# Patient Record
Sex: Female | Born: 1970 | Race: White | Hispanic: No | Marital: Married | Smoking: Never smoker
Health system: Southern US, Community
[De-identification: ages and names within clinical notes are randomized; demographics above are authoritative.]

## PROBLEM LIST (undated history)

## (undated) DIAGNOSIS — B019 Varicella without complication: Secondary | ICD-10-CM

## (undated) DIAGNOSIS — B029 Zoster without complications: Secondary | ICD-10-CM

## (undated) DIAGNOSIS — N200 Calculus of kidney: Secondary | ICD-10-CM

## (undated) DIAGNOSIS — Z9229 Personal history of other drug therapy: Secondary | ICD-10-CM

## (undated) DIAGNOSIS — B269 Mumps without complication: Secondary | ICD-10-CM

## (undated) HISTORY — DX: Varicella without complication: B01.9

## (undated) HISTORY — DX: Calculus of kidney: N20.0

## (undated) HISTORY — DX: Mumps without complication: B26.9

## (undated) HISTORY — DX: Zoster without complications: B02.9

## (undated) HISTORY — DX: Personal history of other drug therapy: Z92.29

---

## 1998-06-15 HISTORY — PX: COLPOSCOPY: SHX161

## 2015-02-28 ENCOUNTER — Encounter: Payer: Self-pay | Admitting: Family

## 2015-02-28 ENCOUNTER — Telehealth: Payer: Self-pay | Admitting: Family

## 2015-02-28 ENCOUNTER — Ambulatory Visit (INDEPENDENT_AMBULATORY_CARE_PROVIDER_SITE_OTHER)
Admission: RE | Admit: 2015-02-28 | Discharge: 2015-02-28 | Disposition: A | Discharge status: Home / Self Care | Payer: 59 | Source: Ambulatory Visit | Attending: Family | Admitting: Family

## 2015-02-28 ENCOUNTER — Encounter (INDEPENDENT_AMBULATORY_CARE_PROVIDER_SITE_OTHER): Payer: Self-pay

## 2015-02-28 ENCOUNTER — Ambulatory Visit (INDEPENDENT_AMBULATORY_CARE_PROVIDER_SITE_OTHER): Payer: 59 | Admitting: Family

## 2015-02-28 VITALS — BP 128/88 | HR 83 | Temp 97.6°F | Resp 18 | Ht 67.0 in | Wt 131.0 lb

## 2015-02-28 DIAGNOSIS — M25552 Pain in left hip: Secondary | ICD-10-CM | POA: Diagnosis not present

## 2015-02-28 DIAGNOSIS — Z1231 Encounter for screening mammogram for malignant neoplasm of breast: Secondary | ICD-10-CM | POA: Diagnosis not present

## 2015-02-28 NOTE — Progress Notes (Signed)
Subjective:    Patient ID: Connie Robinson, female    DOB: Aug 13, 1970, 44 y.o.   MRN: [id number]  Chief Complaint  Patient presents with  . Establish Care    states she has decreased mobility in her left hip, it is starting to affect her exercising, feels tight has been going on x2 months    HPI:  Connie Robinson is a 44 y.o. female with a PMH of kidney stones who presents today for an office visit to establish care.   1.) Mammogram - Has been performing mammograms since the age of 52 and notes she is due for her yearly mammogram.   2.) Hip pain - This is a new problem. Associated symptom of decreased mobility and pain located in the anterior aspect of  her left hip has been going on for about 2 months. Pain is described as occasional pinching sensation. Notes that she is able to workout on the Arc trainer, but is not able to use the elliptical. Modifying factor of yoga and stretching does help some, however she has noted there is restriction and not able to perform several positions that she used to be able to do. Denies any over the counter medications. Functionality she is able to walk up and down stairs and perform activities of daily living without difficulty. She denies trauma or sounds/sensation heard or felt.    No Known Allergies   No outpatient prescriptions prior to visit.   No facility-administered medications prior to visit.     Past Medical History  Diagnosis Date  . Kidney stones      History reviewed. No pertinent past surgical history.   Family History  Problem Relation Age of Onset  . Hypertension Mother   . Hyperlipidemia Paternal Grandmother   . Hyperlipidemia Paternal Grandfather      Social History   Social History  . Marital Status: Married    Spouse Name: N/A  . Number of Children: 2  . Years of Education: 18   Occupational History  . [employer]     Social History Main Topics  . Smoking status: Never Smoker   . Smokeless tobacco:  Never Used  . Alcohol Use: 0.0 oz/week    0 Standard drinks or equivalent per week     Comment: occasional  . Drug Use: No  . Sexual Activity: Yes    Birth Control/ Protection: Pill   Other Topics Concern  . Not on file   Social History Narrative   Fun: Ski, work out, travel   Denies religious beliefs effecting health care.    Denies abuse and feels safe at home.     Review of Systems  Constitutional: Negative for fever and chills.  Musculoskeletal:       Positive for decreased hip motion.   Neurological: Negative for weakness and numbness.      Objective:    BP 128/88 mmHg  Pulse 83  Temp(Src) 97.6 F (36.4 C) (Oral)  Resp 18  Ht  (1.702 m)  Wt 131 lb (59.421 kg)  BMI 20.51 kg/m2  SpO2 97% Nursing note and vital signs reviewed.  Physical Exam  Constitutional: She is oriented to person, place, and time. She appears well-developed and well-nourished. No distress.  Cardiovascular: Normal rate, regular rhythm, normal heart sounds and intact distal pulses.   Pulmonary/Chest: Effort normal and breath sounds normal.  Musculoskeletal:  Left hip with no obvious deformity, discoloration, or edema. Unable to elicit tenderness. Slight decrease in hip flexion compared  to opposite side, however range of motion in all directions is within normal limits. Muscle strength is 5 out of 5 in all directions. Distal pulses and sensation are intact and appropriate. Negative Thomas test and negative hip scouring  Neurological: She is alert and oriented to person, place, and time.  Skin: Skin is warm and dry.  Psychiatric: She has a normal mood and affect. Her behavior is normal. Judgment and thought content normal.       Assessment & Plan:   Problem List Items Addressed This Visit      Other   Left hip pain - Primary    Decreased hip mobility of undetermined origin. Appears to be muscular as symptoms improve with stretching, however no range of motion deficits exist. Symptoms are  only during activities like the ellipitical but are fine with the Arc trainer. Obtain x-ray to rule out underlying pathology. Refer to sports medicine for further assessment.       Relevant Orders   Ambulatory referral to Sports Medicine   DG HIP UNILAT WITH PELVIS 2-3 VIEWS LEFT   Encounter for screening mammogram for breast cancer   Relevant Orders   MM DIGITAL SCREENING BILATERAL

## 2015-02-28 NOTE — Progress Notes (Signed)
Pre visit review using our clinic review tool, if applicable. No additional management support is needed unless otherwise documented below in the visit note. 

## 2015-02-28 NOTE — Telephone Encounter (Signed)
Please inform patient that her x-ray is normal with no abnormality. Therefore please continue to follow up with Sports Medicine as planned.

## 2015-02-28 NOTE — Patient Instructions (Addendum)
Thank you for choosing [facility].  Summary/Instructions:  Please stop by radiology on the basement level of the building for your x-rays. Your results will be released to MyChart (or called to you) after review, usually within 72 hours after test completion. If any treatments or changes are necessary, you will be notified at that same time.  Referrals have been made during this visit. You should expect to hear back from our schedulers in about 7-10 days in regards to establishing an appointment with the specialists we discussed.   If your symptoms worsen or fail to improve, please contact our office for further instruction, or in case of emergency go directly to the emergency room at the closest medical facility.    Hip Exercises RANGE OF MOTION (ROM) AND STRETCHING EXERCISES  These exercises may help you when beginning to rehabilitate your injury. Doing them too aggressively can worsen your condition. Complete them slowly and gently. Your symptoms may resolve with or without further involvement from your physician, physical therapist or athletic trainer. While completing these exercises, remember:   Restoring tissue flexibility helps normal motion to return to the joints. This allows healthier, less painful movement and activity.  An effective stretch should be held for at least 30 seconds.  A stretch should never be painful. You should only feel a gentle lengthening or release in the stretched tissue. If these stretches worsen your symptoms even when done gently, consult your physician, physical therapist or athletic trainer. STRETCH - Hamstrings, Supine   Lie on your back. Loop a belt or towel over the ball of your right / left foot.  Straighten your right / left knee and slowly pull on the belt to raise your leg. Do not allow the right / left knee to bend. Keep your opposite leg flat on the floor.  Raise the leg until you feel a gentle stretch behind your right / left knee or  thigh. Hold this position for __________ seconds. Repeat __________ times. Complete this stretch __________ times per day.  STRETCH - Hip Rotators   Lie on your back on a firm surface. Grasp your right / left knee with your right / left hand and your ankle with your opposite hand.  Keeping your hips and shoulders firmly planted, gently pull your right / left knee and rotate your lower leg toward your opposite shoulder until you feel a stretch in your buttocks.  Hold this stretch for __________ seconds. Repeat this stretch __________ times. Complete this stretch __________ times per day. STRETCH - Hamstrings/Adductors, V-Sit   Sit on the floor with your legs extended in a large "V," keeping your knees straight.  With your head and chest upright, bend at your waist reaching for your right foot to stretch your left adductors.  You should feel a stretch in your left inner thigh. Hold for __________ seconds.  Return to the upright position to relax your leg muscles.  Continuing to keep your chest upright, bend straight forward at your waist to stretch your hamstrings.  You should feel a stretch behind both of your thighs and/or knees. Hold for __________ seconds.  Return to the upright position to relax your leg muscles.  Repeat steps 2 through 4 for opposite leg. Repeat __________ times. Complete this exercise __________ times per day.  STRETCHING - Hip Flexors, Lunge  Half kneel with your right / left knee on the floor and your opposite knee bent and directly over your ankle.  Keep good posture with your head  over your shoulders. Tighten your buttocks to point your tailbone downward; this will prevent your back from arching too much.  You should feel a gentle stretch in the front of your thigh and/or hip. If you do not feel any resistance, slightly slide your opposite foot forward and then slowly lunge forward so your knee once again lines up over your ankle. Be sure your tailbone  remains pointed downward.  Hold this stretch for __________ seconds. Repeat __________ times. Complete this stretch __________ times per day. STRENGTHENING EXERCISES These exercises may help you when beginning to rehabilitate your injury. They may resolve your symptoms with or without further involvement from your physician, physical therapist or athletic trainer. While completing these exercises, remember:   Muscles can gain both the endurance and the strength needed for everyday activities through controlled exercises.  Complete these exercises as instructed by your physician, physical therapist or athletic trainer. Progress the resistance and repetitions only as guided.  You may experience muscle soreness or fatigue, but the pain or discomfort you are trying to eliminate should never worsen during these exercises. If this pain does worsen, stop and make certain you are following the directions exactly. If the pain is still present after adjustments, discontinue the exercise until you can discuss the trouble with your clinician. STRENGTH - Hip Extensors, Bridge   Lie on your back on a firm surface. Bend your knees and place your feet flat on the floor.  Tighten your buttocks muscles and lift your bottom off the floor until your trunk is level with your thighs. You should feel the muscles in your buttocks and back of your thighs working. If you do not feel these muscles, slide your feet 1-2 inches further away from your buttocks.  Hold this position for __________ seconds.  Slowly lower your hips to the starting position and allow your buttock muscles relax completely before beginning the next repetition.  If this exercise is too easy, you may cross your arms over your chest. Repeat __________ times. Complete this exercise __________ times per day.  STRENGTH - Hip Abductors, Straight Leg Raises  Be aware of your form throughout the entire exercise so that you exercise the correct muscles.  Sloppy form means that you are not strengthening the correct muscles.  Lie on your side so that your head, shoulders, knee and hip line up. You may bend your lower knee to help maintain your balance. Your right / left leg should be on top.  Roll your hips slightly forward, so that your hips are stacked directly over each other and your right / left knee is facing forward.  Lift your top leg up 4-6 inches, leading with your heel. Be sure that your foot does not drift forward or that your knee does not roll toward the ceiling.  Hold this position for __________ seconds. You should feel the muscles in your outer hip lifting (you may not notice this until your leg begins to tire).  Slowly lower your leg to the starting position. Allow the muscles to fully relax before beginning the next repetition. Repeat __________ times. Complete this exercise __________ times per day.  STRENGTH - Hip Adductors, Straight Leg Raises   Lie on your side so that your head, shoulders, knee and hip line up. You may place your upper foot in front to help maintain your balance. Your right / left leg should be on the bottom.  Roll your hips slightly forward, so that your hips are stacked directly over  each other and your right / left knee is facing forward.  Tense the muscles in your inner thigh and lift your bottom leg 4-6 inches. Hold this position for __________ seconds.  Slowly lower your leg to the starting position. Allow the muscles to fully relax before beginning the next repetition. Repeat __________ times. Complete this exercise __________ times per day.  STRENGTH - Quadriceps, Straight Leg Raises  Quality counts! Watch for signs that the quadriceps muscle is working to insure you are strengthening the correct muscles and not "cheating" by substituting with healthier muscles.  Lay on your back with your right / left leg extended and your opposite knee bent.  Tense the muscles in the front of your right /  left thigh. You should see either your knee cap slide up or increased dimpling just above the knee. Your thigh may even quiver.  Tighten these muscles even more and raise your leg 4 to 6 inches off the floor. Hold for right / left seconds.  Keeping these muscles tense, lower your leg.  Relax the muscles slowly and completely in between each repetition. Repeat __________ times. Complete this exercise __________ times per day.  STRENGTH - Hip Abductors, Standing  Tie one end of a rubber exercise band/tubing to a secure surface (table, pole) and tie a loop at the other end.  Place the loop around your right / left ankle. Keeping your ankle with the band directly opposite of the secured end, step away until there is tension in the tube/band.  Hold onto a chair as needed for balance.  Keeping your back upright, your shoulders over your hips, and your toes pointing forward, lift your right / left leg out to your side. Be sure to lift your leg with your hip muscles. Do not "throw" your leg or tip your body to lift your leg.  Slowly and with control, return to the starting position. Repeat exercise __________ times. Complete this exercise __________ times per day.  STRENGTH - Quadriceps, Squats  Stand in a door frame so that your feet and knees are in line with the frame.  Use your hands for balance, not support, on the frame.  Slowly lower your weight, bending at the hips and knees. Keep your lower legs upright so that they are parallel with the door frame. Squat only within the range that does not increase your knee pain. Never let your hips drop below your knees.  Slowly return upright, pushing with your legs, not pulling with your hands. Document Released: 06/19/2005 Document Revised: 08/24/2011 Document Reviewed: 09/13/2008 [facility] Patient Information 2015 [place], [place]. This information is not intended to replace advice given to you by your health care provider. Make sure you discuss  any questions you have with your health care provider.

## 2015-02-28 NOTE — Assessment & Plan Note (Signed)
Decreased hip mobility of undetermined origin. Appears to be muscular as symptoms improve with stretching, however no range of motion deficits exist. Symptoms are only during activities like the ellipitical but are fine with the Arc trainer. Obtain x-ray to rule out underlying pathology. Refer to sports medicine for further assessment.

## 2015-03-01 ENCOUNTER — Encounter: Payer: Self-pay | Admitting: Family

## 2015-03-01 NOTE — Telephone Encounter (Signed)
LVM letting pt know results. 

## 2015-03-15 ENCOUNTER — Encounter: Payer: Self-pay | Admitting: Family

## 2015-03-15 ENCOUNTER — Ambulatory Visit (INDEPENDENT_AMBULATORY_CARE_PROVIDER_SITE_OTHER): Payer: 59 | Admitting: Family

## 2015-03-15 ENCOUNTER — Other Ambulatory Visit (INDEPENDENT_AMBULATORY_CARE_PROVIDER_SITE_OTHER): Payer: 59

## 2015-03-15 VITALS — BP 122/78 | HR 85 | Temp 98.6°F | Resp 18 | Ht 67.0 in | Wt 130.0 lb

## 2015-03-15 DIAGNOSIS — Z0000 Encounter for general adult medical examination without abnormal findings: Secondary | ICD-10-CM

## 2015-03-15 LAB — CBC
HEMATOCRIT: 43 % (ref 36.0–46.0)
HEMOGLOBIN: 14.6 g/dL (ref 12.0–15.0)
MCHC: 33.9 g/dL (ref 30.0–36.0)
MCV: 92.5 fl (ref 78.0–100.0)
Platelets: 295 10*3/uL (ref 150.0–400.0)
RBC: 4.65 Mil/uL (ref 3.87–5.11)
RDW: 12.2 % (ref 11.5–15.5)
WBC: 9.9 10*3/uL (ref 4.0–10.5)

## 2015-03-15 LAB — COMPREHENSIVE METABOLIC PANEL
ALT: 12 U/L (ref 0–35)
AST: 12 U/L (ref 0–37)
Albumin: 4.3 g/dL (ref 3.5–5.2)
Alkaline Phosphatase: 36 U/L — ABNORMAL LOW (ref 39–117)
BUN: 10 mg/dL (ref 6–23)
CO2: 27 meq/L (ref 19–32)
Calcium: 9.1 mg/dL (ref 8.4–10.5)
Chloride: 105 mEq/L (ref 96–112)
Creatinine, Ser: 0.67 mg/dL (ref 0.40–1.20)
GFR: 102.02 mL/min (ref >60.00)
GLUCOSE: 88 mg/dL (ref 70–99)
POTASSIUM: 4.1 meq/L (ref 3.5–5.1)
Sodium: 139 mEq/L (ref 135–145)
Total Bilirubin: 0.4 mg/dL (ref 0.2–1.2)
Total Protein: 6.9 g/dL (ref 6.0–8.3)

## 2015-03-15 LAB — LIPID PANEL
Cholesterol: 144 mg/dL (ref 0–200)
HDL: 49 mg/dL (ref >39.00)
LDL CALC: 77 mg/dL (ref 0–99)
NONHDL: 95.34
Total CHOL/HDL Ratio: 3
Triglycerides: 90 mg/dL (ref 0.0–149.0)
VLDL: 18 mg/dL (ref 0.0–40.0)

## 2015-03-15 LAB — TSH: TSH: 1.27 u[IU]/mL (ref 0.35–4.50)

## 2015-03-15 NOTE — Assessment & Plan Note (Signed)
1) Anticipatory Guidance: Discussed importance of wearing a seatbelt while driving and not texting while driving; changing batteries in smoke detector at least once annually; wearing suntan lotion when outside; eating a balanced and moderate diet; getting physical activity at least 30 minutes per day.  2) Immunizations / Screenings / Labs:  Declines flu today. All other immunizations are up-to-date per recommendations. Due for a dental screen which will be scheduled independently. All other screenings are up-to-date per recommendations. Obtain CBC, CMET, Lipid profile and TSH.   Overall well exam with minimal risk factors for cardiovascular disease. She has normal blood pressure, exercises regularly, and is a good body weight. Continue healthy lifestyle behaviors. Follow-up prevention exam in 1 year. Follow-up office visit pending blood work if needed.

## 2015-03-15 NOTE — Patient Instructions (Addendum)
Thank you for choosing [facility].  Summary/Instructions:  Please stop by the lab on the basement level of the building for your blood work. Your results will be released to MyChart (or called to you) after review, usually within 72 hours after test completion. If any changes need to be made, you will be notified at that same time.  If your symptoms worsen or fail to improve, please contact our office for further instruction, or in case of emergency go directly to the emergency room at the closest medical facility.    Health Maintenance Adopting a healthy lifestyle and getting preventive care can go a long way to promote health and wellness. Talk with your health care provider about what schedule of regular examinations is right for you. This is a good chance for you to check in with your provider about disease prevention and staying healthy. In between checkups, there are plenty of things you can do on your own. Experts have done a lot of research about which lifestyle changes and preventive measures are most likely to keep you healthy. Ask your health care provider for more information. WEIGHT AND DIET  Eat a healthy diet  Be sure to include plenty of vegetables, fruits, low-fat dairy products, and lean protein.  Do not eat a lot of foods high in solid fats, added sugars, or salt.  Get regular exercise. This is one of the most important things you can do for your health.  Most adults should exercise for at least 150 minutes each week. The exercise should increase your heart rate and make you sweat (moderate-intensity exercise).  Most adults should also do strengthening exercises at least twice a week. This is in addition to the moderate-intensity exercise.  Maintain a healthy weight  Body mass index (BMI) is a measurement that can be used to identify possible weight problems. It estimates body fat based on height and weight. Your health care provider can help determine your BMI  and help you achieve or maintain a healthy weight.  For females 20 years of age and older:   A BMI below 18.5 is considered underweight.  A BMI of 18.5 to 24.9 is normal.  A BMI of 25 to 29.9 is considered overweight.  A BMI of 30 and above is considered obese.  Watch levels of cholesterol and blood lipids  You should start having your blood tested for lipids and cholesterol at 43 years of age, then have this test every 5 years.  You may need to have your cholesterol levels checked more often if:  Your lipid or cholesterol levels are high.  You are older than 43 years of age.  You are at high risk for heart disease.  CANCER SCREENING   Lung Cancer  Lung cancer screening is recommended for adults 55-80 years old who are at high risk for lung cancer because of a history of smoking.  A yearly low-dose CT scan of the lungs is recommended for people who:  Currently smoke.  Have quit within the past 15 years.  Have at least a 30-pack-year history of smoking. A pack year is smoking an average of one pack of cigarettes a day for 1 year.  Yearly screening should continue until it has been 15 years since you quit.  Yearly screening should stop if you develop a health problem that would prevent you from having lung cancer treatment.  Breast Cancer  Practice breast self-awareness. This means understanding how your breasts normally appear and feel.  It   also means doing regular breast self-exams. Let your health care provider know about any changes, no matter how small.  If you are in your 20s or 30s, you should have a clinical breast exam (CBE) by a health care provider every 1-3 years as part of a regular health exam.  If you are 40 or older, have a CBE every year. Also consider having a breast X-ray (mammogram) every year.  If you have a family history of breast cancer, talk to your health care provider about genetic screening.  If you are at high risk for breast cancer,  talk to your health care provider about having an MRI and a mammogram every year.  Breast cancer gene (BRCA) assessment is recommended for women who have family members with BRCA-related cancers. BRCA-related cancers include:  Breast.  Ovarian.  Tubal.  Peritoneal cancers.  Results of the assessment will determine the need for genetic counseling and BRCA1 and BRCA2 testing. Cervical Cancer Routine pelvic examinations to screen for cervical cancer are no longer recommended for nonpregnant women who are considered low risk for cancer of the pelvic organs (ovaries, uterus, and vagina) and who do not have symptoms. A pelvic examination may be necessary if you have symptoms including those associated with pelvic infections. Ask your health care provider if a screening pelvic exam is right for you.   The Pap test is the screening test for cervical cancer for women who are considered at risk.  If you had a hysterectomy for a problem that was not cancer or a condition that could lead to cancer, then you no longer need Pap tests.  If you are older than 65 years, and you have had normal Pap tests for the past 10 years, you no longer need to have Pap tests.  If you have had past treatment for cervical cancer or a condition that could lead to cancer, you need Pap tests and screening for cancer for at least 20 years after your treatment.  If you no longer get a Pap test, assess your risk factors if they change (such as having a new sexual partner). This can affect whether you should start being screened again.  Some women have medical problems that increase their chance of getting cervical cancer. If this is the case for you, your health care provider may recommend more frequent screening and Pap tests.  The human papillomavirus (HPV) test is another test that may be used for cervical cancer screening. The HPV test looks for the virus that can cause cell changes in the cervix. The cells collected  during the Pap test can be tested for HPV.  The HPV test can be used to screen women 30 years of age and older. Getting tested for HPV can extend the interval between normal Pap tests from three to five years.  An HPV test also should be used to screen women of any age who have unclear Pap test results.  After 43 years of age, women should have HPV testing as often as Pap tests.  Colorectal Cancer  This type of cancer can be detected and often prevented.  Routine colorectal cancer screening usually begins at 43 years of age and continues through 43 years of age.  Your health care provider may recommend screening at an earlier age if you have risk factors for colon cancer.  Your health care provider may also recommend using home test kits to check for hidden blood in the stool.  A small camera at the end   of a tube can be used to examine your colon directly (sigmoidoscopy or colonoscopy). This is done to check for the earliest forms of colorectal cancer.  Routine screening usually begins at age 50.  Direct examination of the colon should be repeated every 5-10 years through 43 years of age. However, you may need to be screened more often if early forms of precancerous polyps or small growths are found. Skin Cancer  Check your skin from head to toe regularly.  Tell your health care provider about any new moles or changes in moles, especially if there is a change in a mole's shape or color.  Also tell your health care provider if you have a mole that is larger than the size of a pencil eraser.  Always use sunscreen. Apply sunscreen liberally and repeatedly throughout the day.  Protect yourself by wearing long sleeves, pants, a wide-brimmed hat, and sunglasses whenever you are outside. HEART DISEASE, DIABETES, AND HIGH BLOOD PRESSURE   Have your blood pressure checked at least every 1-2 years. High blood pressure causes heart disease and increases the risk of stroke.  If you are  between 55 years and 79 years old, ask your health care provider if you should take aspirin to prevent strokes.  Have regular diabetes screenings. This involves taking a blood sample to check your fasting blood sugar level.  If you are at a normal weight and have a low risk for diabetes, have this test once every three years after 43 years of age.  If you are overweight and have a high risk for diabetes, consider being tested at a younger age or more often. PREVENTING INFECTION  Hepatitis B  If you have a higher risk for hepatitis B, you should be screened for this virus. You are considered at high risk for hepatitis B if:  You were born in a country where hepatitis B is common. Ask your health care provider which countries are considered high risk.  Your parents were born in a high-risk country, and you have not been immunized against hepatitis B (hepatitis B vaccine).  You have HIV or AIDS.  You use needles to inject street drugs.  You live with someone who has hepatitis B.  You have had sex with someone who has hepatitis B.  You get hemodialysis treatment.  You take certain medicines for conditions, including cancer, organ transplantation, and autoimmune conditions. Hepatitis C  Blood testing is recommended for:  Everyone born from 1945 through 1965.  Anyone with known risk factors for hepatitis C. Sexually transmitted infections (STIs)  You should be screened for sexually transmitted infections (STIs) including gonorrhea and chlamydia if:  You are sexually active and are younger than 43 years of age.  You are older than 43 years of age and your health care provider tells you that you are at risk for this type of infection.  Your sexual activity has changed since you were last screened and you are at an increased risk for chlamydia or gonorrhea. Ask your health care provider if you are at risk.  If you do not have HIV, but are at risk, it may be recommended that you  take a prescription medicine daily to prevent HIV infection. This is called pre-exposure prophylaxis (PrEP). You are considered at risk if:  You are sexually active and do not regularly use condoms or know the HIV status of your partner(s).  You take drugs by injection.  You are sexually active with a partner who has HIV.   with your health care provider about whether you are at high risk of being infected with HIV. If you choose to begin PrEP, you should first be tested for HIV. You should then be tested every 3 months for as long as you are taking PrEP.  PREGNANCY   If you are premenopausal and you may become pregnant, ask your health care provider about preconception counseling.  If you may become pregnant, take 400 to 800 micrograms (mcg) of folic acid every day.  If you want to prevent pregnancy, talk to your health care provider about birth control (contraception). OSTEOPOROSIS AND MENOPAUSE   Osteoporosis is a disease in which the bones lose minerals and strength with aging. This can result in serious bone fractures. Your risk for osteoporosis can be identified using a bone density scan.  If you are 59 years of age or older, or if you are at risk for osteoporosis and fractures, ask your health care provider if you should be screened.  Ask your health care provider whether you should take a calcium or vitamin D supplement to lower your risk for osteoporosis.  Menopause may have certain physical symptoms and risks.  Hormone replacement therapy may reduce some of these symptoms and risks. Talk to your health care provider about whether hormone replacement therapy is right for you.  HOME CARE INSTRUCTIONS   Schedule regular health, dental, and eye exams.  Stay current with your immunizations.   Do not use any tobacco products including cigarettes, chewing tobacco, or electronic cigarettes.  If you are pregnant, do not drink alcohol.  If you are breastfeeding, limit how much and  how often you drink alcohol.  Limit alcohol intake to no more than 1 drink per day for nonpregnant women. One drink equals 12 ounces of beer, 5 ounces of wine, or 1 ounces of hard liquor.  Do not use street drugs.  Do not share needles.  Ask your health care provider for help if you need support or information about quitting drugs.  Tell your health care provider if you often feel depressed.  Tell your health care provider if you have ever been abused or do not feel safe at home. Document Released: 12/15/2010 Document Revised: 10/16/2013 Document Reviewed: 05/03/2013 [facility] Patient Information 2015 [place], [place]. This information is not intended to replace advice given to you by your health care provider. Make sure you discuss any questions you have with your health care provider.

## 2015-03-15 NOTE — Progress Notes (Signed)
Pre visit review using our clinic review tool, if applicable. No additional management support is needed unless otherwise documented below in the visit note. 

## 2015-03-15 NOTE — Progress Notes (Signed)
Subjective:    Patient ID: Connie Robinson, female    DOB: [birth year], 44 y.o.   MRN: [id number]  Chief Complaint  Patient presents with  . CPE    Fasting    HPI:  Connie Robinson is a 44 y.o. female who presents today for an annual wellness visit.   1) Health Maintenance -   Diet - Averages about 3 meals consisting of a balanced and healthy diet. Caffeine intake of about 3 cups of hot tea per day.   Exercise - 3-4x per week to the gym or yoga.    2) Preventative Exams / Immunizations:  Dental -- Due for exam   Vision -- Up to date   Health Maintenance  Topic Date Due  . HIV Screening  01/12/1987  . INFLUENZA VACCINE  02/28/2016 (Originally 01/14/2015)  . PAP SMEAR  10/14/2017  . TETANUS/TDAP  02/03/2020     There is no immunization history on file for this patient.  No Known Allergies   Outpatient Prescriptions Prior to Visit  Medication Sig Dispense Refill  . Multiple Vitamins-Minerals (MULTIVITAMIN & MINERAL PO) Take by mouth.     No facility-administered medications prior to visit.     Past Medical History  Diagnosis Date  . Kidney stones      No past surgical history on file.   Family History  Problem Relation Age of Onset  . Hypertension Mother   . Hyperlipidemia Paternal Grandmother   . Hyperlipidemia Paternal Grandfather      Social History   Social History  . Marital Status: Married    Spouse Name: N/A  . Number of Children: 2  . Years of Education: 18   Occupational History  . [employer]     Social History Main Topics  . Smoking status: Never Smoker   . Smokeless tobacco: Never Used  . Alcohol Use: 0.0 oz/week    0 Standard drinks or equivalent per week     Comment: occasional  . Drug Use: No  . Sexual Activity: Yes    Birth Control/ Protection: Pill   Other Topics Concern  . Not on file   Social History Narrative   Fun: Ski, work out, travel   Denies religious beliefs effecting health care.    Denies abuse and  feels safe at home.      Review of Systems  Constitutional: Denies fever, chills, fatigue, or significant weight gain/loss. HENT: Head: Denies headache or neck pain Ears: Denies changes in hearing, ringing in ears, earache, drainage Nose: Denies discharge, stuffiness, itching, nosebleed, sinus pain Throat: Denies sore throat, hoarseness, dry mouth, sores, thrush Eyes: Denies loss/changes in vision, pain, redness, blurry/double vision, flashing lights Cardiovascular: Denies chest pain/discomfort, tightness, palpitations, shortness of breath with activity, difficulty lying down, swelling, sudden awakening with shortness of breath Respiratory: Denies shortness of breath, cough, sputum production, wheezing Gastrointestinal: Denies dysphasia, heartburn, change in appetite, nausea, change in bowel habits, rectal bleeding, constipation, diarrhea, yellow skin or eyes Genitourinary: Denies frequency, urgency, burning/pain, blood in urine, incontinence, change in urinary strength. Musculoskeletal: Denies muscle/joint pain, stiffness, back pain, redness or swelling of joints, trauma Skin: Denies rashes, lumps, itching, dryness, color changes, or hair/nail changes Neurological: Denies dizziness, fainting, seizures, weakness, numbness, tingling, tremor Psychiatric - Denies nervousness, stress, depression or memory loss Endocrine: Denies heat or cold intolerance, sweating, frequent urination, excessive thirst, changes in appetite Hematologic: Denies ease of bruising or bleeding     Objective:    BP 122/78 mmHg  Pulse 85  Temp(Src) 98.6 F (37 C) (Oral)  Resp 18  Ht  (1.702 m)  Wt 130 lb (58.968 kg)  BMI 20.36 kg/m2  SpO2 99%  LMP 02/07/2015 (Approximate) Nursing note and vital signs reviewed.  Physical Exam  Constitutional: She is oriented to person, place, and time. She appears well-developed and well-nourished.  HENT:  Head: Normocephalic.  Right Ear: Hearing, tympanic membrane,  external ear and ear canal normal.  Left Ear: Hearing, tympanic membrane, external ear and ear canal normal.  Nose: Nose normal.  Mouth/Throat: Uvula is midline, oropharynx is clear and moist and mucous membranes are normal.  Eyes: Conjunctivae and EOM are normal. Pupils are equal, round, and reactive to light.  Neck: Neck supple. No JVD present. No tracheal deviation present. No thyromegaly present.  Cardiovascular: Normal rate, regular rhythm, normal heart sounds and intact distal pulses.   Pulmonary/Chest: Effort normal and breath sounds normal.  Abdominal: Soft. Bowel sounds are normal. She exhibits no distension and no mass. There is no tenderness. There is no rebound and no guarding.  Musculoskeletal: Normal range of motion. She exhibits no edema or tenderness.  Lymphadenopathy:    She has no cervical adenopathy.  Neurological: She is alert and oriented to person, place, and time. She has normal reflexes. No cranial nerve deficit. She exhibits normal muscle tone. Coordination normal.  Skin: Skin is warm and dry.  Psychiatric: She has a normal mood and affect. Her behavior is normal. Judgment and thought content normal.      Assessment & Plan:   Problem List Items Addressed This Visit      Other   Routine general medical examination at a health care facility - Primary    1) Anticipatory Guidance: Discussed importance of wearing a seatbelt while driving and not texting while driving; changing batteries in smoke detector at least once annually; wearing suntan lotion when outside; eating a balanced and moderate diet; getting physical activity at least 30 minutes per day.  2) Immunizations / Screenings / Labs:  Declines flu today. All other immunizations are up-to-date per recommendations. Due for a dental screen which will be scheduled independently. All other screenings are up-to-date per recommendations. Obtain CBC, CMET, Lipid profile and TSH.   Overall well exam with minimal risk  factors for cardiovascular disease. She has normal blood pressure, exercises regularly, and is a good body weight. Continue healthy lifestyle behaviors. Follow-up prevention exam in 1 year. Follow-up office visit pending blood work if needed.      Relevant Orders   CBC   Lipid panel   TSH   Comprehensive metabolic panel

## 2015-03-18 ENCOUNTER — Encounter: Payer: Self-pay | Admitting: Family

## 2015-03-19 ENCOUNTER — Ambulatory Visit (INDEPENDENT_AMBULATORY_CARE_PROVIDER_SITE_OTHER): Payer: 59 | Admitting: Family Medicine

## 2015-03-19 ENCOUNTER — Encounter: Payer: Self-pay | Admitting: Family Medicine

## 2015-03-19 ENCOUNTER — Other Ambulatory Visit (INDEPENDENT_AMBULATORY_CARE_PROVIDER_SITE_OTHER): Payer: 59

## 2015-03-19 VITALS — BP 116/80 | HR 74 | Ht 67.0 in | Wt 132.0 lb

## 2015-03-19 DIAGNOSIS — M25552 Pain in left hip: Secondary | ICD-10-CM | POA: Diagnosis not present

## 2015-03-19 MED ORDER — NITROGLYCERIN 0.2 MG/HR TD PT24: MEDICATED_PATCH | TRANSDERMAL | Status: DC

## 2015-03-19 NOTE — Patient Instructions (Signed)
Good to see you.  Ice 20 minutes 2 times daily. Usually after activity and before bed. Exercises 3 times a week.  Compression sleeve Duexis 3 times daily for next 3 days.  Vitmain D 2000 IU daily Nitroglycerin Protocol   Apply 1/4 nitroglycerin patch to affected area daily.  Change position of patch within the affected area every 24 hours.  You may experience a headache during the first 1-2 weeks of using the patch, these should subside.  If you experience headaches after beginning nitroglycerin patch treatment, you may take your preferred over the counter pain reliever.  Another side effect of the nitroglycerin patch is skin irritation or rash related to patch adhesive.  Please notify our office if you develop more severe headaches or rash, and stop the patch.  Tendon healing with nitroglycerin patch may require 12 to 24 weeks depending on the extent of injury.  Men should not use if taking Viagra, Cialis, or Levitra.   Do not use if you have migraines or rosacea.  See me again in 3-4 weeks.

## 2015-03-19 NOTE — Progress Notes (Signed)
[facility] [address] [place], [place] [address] Phone: [phone] Subjective:    I'm seeing this patient by the request  of:  Jeanine Luz, FNP   CC: Left hip pain and decreased mobility.  BJY:[id number] Connie Robinson is a 44 y.o. female coming in with complaint of left hip pain as well as decreased mobility.   Patient did move here for months ago. Patient noticed that this started within the last 6 weeks. Patient has noticed that she is decreased range of motion. No significant pain though with it. His stopping her from certain activities such as her yoga because she cannot be as flexible as she was previously. Denies any radiation down the leg or any numbness or weakness. Continues to do daily activities. Patient saw another provider and did have x-rays. X-rays were reviewed by me and had no significant bony abnormality. Patient may have some very minimal scoliosis of the anterior inferior aspect of the acetabular acetabular area. Patient rates the difficulty is 4 out of 10 but once again does not have any significant pain. Would not consider that it's been worsening over the course of time. Patient though states that it is not improving either.  Past Medical History  Diagnosis Date  . Kidney stones    No past surgical history on file. Social History  Substance Use Topics  . Smoking status: Never Smoker   . Smokeless tobacco: Never Used  . Alcohol Use: 0.0 oz/week    0 Standard drinks or equivalent per week     Comment: occasional   No Known Allergies Family History  Problem Relation Age of Onset  . Hypertension Mother   . Hyperlipidemia Paternal Grandmother   . Hyperlipidemia Paternal Grandfather        Past medical history, social, surgical and family history all reviewed in electronic medical record.   Review of Systems: No headache, visual changes, nausea, vomiting, diarrhea, constipation, dizziness, abdominal pain, skin rash,  fevers, chills, night sweats, weight loss, swollen lymph nodes, body aches, joint swelling, muscle aches, chest pain, shortness of breath, mood changes.   Objective Blood pressure 116/80, pulse 74, height  (1.702 m), weight 132 lb (59.875 kg), last menstrual period 02/07/2015, SpO2 96 %.  General: No apparent distress alert and oriented x3 mood and affect normal, dressed appropriately.  HEENT: Pupils equal, extraocular movements intact  Respiratory: Patient's speak in full sentences and does not appear short of breath  Cardiovascular: No lower extremity edema, non tender, no erythema  Skin: Warm dry intact with no signs of infection or rash on extremities or on axial skeleton.  Abdomen: Soft nontender  Neuro: Cranial nerves II through XII are intact, neurovascularly intact in all extremities with 2+ DTRs and 2+ pulses.  Lymph: No lymphadenopathy of posterior or anterior cervical chain or axillae bilaterally.  Gait normal with good balance and coordination.  MSK:  Non tender with full range of motion and good stability and symmetric strength and tone of shoulders, elbows, wrist,  knee and ankles bilaterally.  QMV:HQIO ROM IR: 15 Deg with pain ER: 25 Degwith external rotation, Flexion: 120 Deg, Extension: 100 Deg, Abduction: 45 Deg, Adduction: 45 Deg Strength IR: 4/5, ER: 5/5, Flexion: 5/5, Extension: 5/5, Abduction: 4/5, Adduction: 5/5 Pelvic alignment unremarkable to inspection and palpation. Standing hip rotation and gait without trendelenburg sign / unsteadiness. Greater trochanter without tenderness to palpation. No tenderness over piriformis and greater trochanter. Decreased range of motion with Pearlean Brownie as well as  pain with internal rotation Positive grind test No SI joint tenderness and normal minimal SI movement. Contralateral hip unremarkable  MSK US performed RU:EAVW hip This study was ordered, performed, and interpreted by Terrilee Files D.O.  Hip: Trochanteric bursa without  swelling or effusion. Acetabular labrum visualized  With calcific changes and mild hypoechoic changes. Patient's joint space though seems to be unremarkable. Femoral neck appears unremarkable without increased power doppler signal along Cortex. Patient's insertion of the groin muscle does have hypoechoic changes but no acute tear appreciated. Mild increase in Doppler flow.  IMPRESSION:  Questionable labral tear with calcific changes anteriorly and mild groin strain.  Procedure note 97110; 15 minutes spent for Therapeutic exercises as stated in above notes.  This included exercises focusing on stretching, strengthening, with significant focus on eccentric aspects.  - Hip strengthening exercises which included:  Pelvic tilt/bracing to help with proper recruitment of the lower abs and pelvic floor muscles  Glute strengthening to properly contract glutes without over-engaging low back and hamstrings - prone hip extension and glute bridge exercises Proper stretching techniques to increase effectiveness for the hip flexors, groin, quads, piriformis and low back when appropriate  Proper technique shown and discussed handout in great detail with ATC.  All questions were discussed and answered.      Impression and Recommendations:     This case required medical decision making of moderate complexity.

## 2015-03-19 NOTE — Assessment & Plan Note (Signed)
Differential includes labral tear, femoral acetabular impingement, versus possible groin injury. Ultrasound today points more towards a labral tear and any other pathologic. Patient given exercises for the groin tear, we discussed icing regimen,  Trial of oral anti-inflammatory, compression sleeve, and we discussed which activities potentially avoid. Patient will try to make these changes as well as start nitroglycerin and warned of potential side effects. Patient come back and see me again in 3-4 weeks. At that time if continuing have pain we may need to consider an MR arthrogram for further evaluation.

## 2015-03-19 NOTE — Progress Notes (Signed)
Pre visit review using our clinic review tool, if applicable. No additional management support is needed unless otherwise documented below in the visit note. 

## 2015-03-26 ENCOUNTER — Encounter: Payer: Self-pay | Admitting: Family

## 2015-04-04 ENCOUNTER — Ambulatory Visit: Payer: Self-pay | Admitting: Adult Health

## 2015-04-16 ENCOUNTER — Ambulatory Visit (INDEPENDENT_AMBULATORY_CARE_PROVIDER_SITE_OTHER): Payer: 59 | Admitting: Family Medicine

## 2015-04-16 ENCOUNTER — Encounter: Payer: Self-pay | Admitting: Family Medicine

## 2015-04-16 VITALS — BP 104/72 | HR 80 | Ht 67.0 in | Wt 132.0 lb

## 2015-04-16 DIAGNOSIS — M25552 Pain in left hip: Secondary | ICD-10-CM

## 2015-04-16 MED ORDER — IBUPROFEN-FAMOTIDINE 800-26.6 MG PO TABS: 1.0000 | ORAL_TABLET | Freq: Three times a day (TID) | ORAL | Status: DC

## 2015-04-16 NOTE — Progress Notes (Signed)
Pre visit review using our clinic review tool, if applicable. No additional management support is needed unless otherwise documented below in the visit note. 

## 2015-04-16 NOTE — Assessment & Plan Note (Signed)
Patient has been considerable improvement with conservative therapy. Patient will increase her left glycerin to have patch daily. Patient also given a prescription for anti-inflammatories. We discussed continuing the range of motion strengthening exercises. His lungs patient continues to do well we would like to see her again in 6 weeks. Patient has any worsening symptoms in the interim we may need to consider advanced imaging to rule out labral tear as well as possibly intra-articular injection for diagnostic and therapeutic purposes.  Spent  25 minutes with patient face-to-face and had greater than 50% of counseling including as described above in assessment and plan.

## 2015-04-16 NOTE — Progress Notes (Signed)
Connie Robinson D.O. [facility] [address] [place], [place] [address] Phone: [phone] Subjective:      CC: Left hip pain and decreased mobility follow up  GEX:BMWUXLKGMWHPI:Subjective Connie Robinson is a 44 y.o. female coming in with complaint of left hip pain as well as decreased mobility.   Patient was seen previously and did have a question of a labral tear as well as a groin strain. Patient was given home exercises, was to do compression, nitroglycerin patches as well as anti-inflammatories. Patient states that she has considerable decrease in pain immediately with the anti-inflammatories. No side effects to the nitroglycerin glycerin. States that the pain is approximate 70% better and does have about 50% increase in range of motion. Continues to have some tightness in the groin but nothing that is stopping her from activity. Patient has started even running a little bit. Continues to go to the gym on a regular basis. Sleeping comfortably.  Past Medical History  Diagnosis Date  . Kidney stones   . Mumps   . Chicken pox   . History of BCG vaccination 01/15/1974, 12/12/1982   No past surgical history on file. Social History  Substance Use Topics  . Smoking status: Never Smoker   . Smokeless tobacco: Never Used  . Alcohol Use: 0.0 oz/week    0 Standard drinks or equivalent per week     Comment: occasional   No Known Allergies Family History  Problem Relation Age of Onset  . Hypertension Mother   . Hyperlipidemia Paternal Grandmother   . Hyperlipidemia Paternal Grandfather        Past medical history, social, surgical and family history all reviewed in electronic medical record.   Review of Systems: No headache, visual changes, nausea, vomiting, diarrhea, constipation, dizziness, abdominal pain, skin rash, fevers, chills, night sweats, weight loss, swollen lymph nodes, body aches, joint swelling, muscle aches, chest pain, shortness of breath, mood changes.    Objective Blood pressure 104/72, pulse 80, height 5\' 7"  (1.702 m), weight 132 lb (59.875 kg), SpO2 96 %.  General: No apparent distress alert and oriented x3 mood and affect normal, dressed appropriately.  HEENT: Pupils equal, extraocular movements intact  Respiratory: Patient's speak in full sentences and does not appear short of breath  Cardiovascular: No lower extremity edema, non tender, no erythema  Skin: Warm dry intact with no signs of infection or rash on extremities or on axial skeleton.  Abdomen: Soft nontender  Neuro: Cranial nerves II through XII are intact, neurovascularly intact in all extremities with 2+ DTRs and 2+ pulses.  Lymph: No lymphadenopathy of posterior or anterior cervical chain or axillae bilaterally.  Gait normal with good balance and coordination.  MSK:  Non tender with full range of motion and good stability and symmetric strength and tone of shoulders, elbows, wrist,  knee and ankles bilaterally.  NUU:VOZDHip:left ROM IR: 5 Deg with  Moderate pain ER: 35 Degwith external rotation improved but less ROM then other side.Flexion: 120 Deg, Extension: 100 Deg, Abduction: 45 Deg, Adduction: 45 Deg Strength IR: 4/5, ER: 5/5, Flexion: 5/5, Extension: 5/5, Abduction: 4+/5, Adduction: 5/5 Pelvic alignment unremarkable to inspection and palpation. Standing hip rotation and gait without trendelenburg sign / unsteadiness. Greater trochanter without tenderness to palpation. No tenderness over piriformis and greater trochanter. Decreased range of motion with Pearlean BrownieFaber as well as pain with internal rotation still but improved.  Positive grind test No SI joint tenderness and normal minimal SI movement. Contralateral hip unremarkable  Impression and Recommendations:     This case required medical decision making of moderate complexity.

## 2015-04-16 NOTE — Patient Instructions (Signed)
Great to see you Duexis 2 times a day for 10 days then 1 time daily before working out Consider increasing the nitroglycerin to half patch daily but if headaches go back down Keep doing everything else See me again in 6 weeks!

## 2015-05-21 ENCOUNTER — Encounter: Payer: Self-pay | Admitting: Family Medicine

## 2015-05-28 ENCOUNTER — Ambulatory Visit (INDEPENDENT_AMBULATORY_CARE_PROVIDER_SITE_OTHER): Payer: 59 | Admitting: Family Medicine

## 2015-05-28 ENCOUNTER — Encounter: Payer: Self-pay | Admitting: Family Medicine

## 2015-05-28 VITALS — BP 122/82 | HR 83 | Ht 67.0 in | Wt 132.0 lb

## 2015-05-28 DIAGNOSIS — M25552 Pain in left hip: Secondary | ICD-10-CM

## 2015-05-28 NOTE — Assessment & Plan Note (Signed)
Patient seems to be doing somewhat better. I do think the patient is doing well and can increase activities as tolerated. Given another handout of exercises that will be more beneficial. I do still have some mild consideration the patient may have a labral tear and we may need further workup such as an MRI. Patient though is pain free at this times we will continue with conservative therapy. Follow-up again in 6-8 weeks.

## 2015-05-28 NOTE — Progress Notes (Signed)
Pre visit review using our clinic review tool, if applicable. No additional management support is needed unless otherwise documented below in the visit note. 

## 2015-05-28 NOTE — Progress Notes (Signed)
[facility] Scale[facility] Zacariah Belue D.O. [facility] [address] [place], [place] [address] Phone: [phone]) [phone] Subjective:     CC: Left hip pain and decreased mobility follow up  MWU:XLKGMWNUUVHPI:Subjective Connie ShuSandrine Robinson is a 44 y.o. female coming in with complaint of left hip pain as well as decreased mobility.   Patient was seen previously and did have a question of a labral tear as well as a groin strain. Patient was given home exercises, was to do compression, nitroglycerin patches as well as anti-inflammatories. Patient was also doing the nitroglycerin was removed. Patient is off all different medications at this time because she is feeling 99% better. Some mild tightness of the hip but no significant pain. Has started running again. Patient states that some tightness at the end but nothing that stops her from activities.  Past Medical History  Diagnosis Date  . Kidney stones   . Mumps   . Chicken pox   . History of BCG vaccination 01/15/1974, 12/12/1982   No past surgical history on file. Social History  Substance Use Topics  . Smoking status: Never Smoker   . Smokeless tobacco: Never Used  . Alcohol Use: 0.0 oz/week    0 Standard drinks or equivalent per week     Comment: occasional   No Known Allergies Family History  Problem Relation Age of Onset  . Hypertension Mother   . Hyperlipidemia Paternal Grandmother   . Hyperlipidemia Paternal Grandfather        Past medical history, social, surgical and family history all reviewed in electronic medical record.   Review of Systems: No headache, visual changes, nausea, vomiting, diarrhea, constipation, dizziness, abdominal pain, skin rash, fevers, chills, night sweats, weight loss, swollen lymph nodes, body aches, joint swelling, muscle aches, chest pain, shortness of breath, mood changes.   Objective Blood pressure 122/82, pulse 83, height 5\' 7"  (1.702 m), weight 132 lb (59.875 kg), SpO2 98 %.  General: No apparent distress alert and  oriented x3 mood and affect normal, dressed appropriately.  HEENT: Pupils equal, extraocular movements intact  Respiratory: Patient's speak in full sentences and does not appear short of breath  Cardiovascular: No lower extremity edema, non tender, no erythema  Skin: Warm dry intact with no signs of infection or rash on extremities or on axial skeleton.  Abdomen: Soft nontender  Neuro: Cranial nerves II through XII are intact, neurovascularly intact in all extremities with 2+ DTRs and 2+ pulses.  Lymph: No lymphadenopathy of posterior or anterior cervical chain or axillae bilaterally.  Gait normal with good balance and coordination.  MSK:  Non tender with full range of motion and good stability and symmetric strength and tone of shoulders, elbows, wrist,  knee and ankles bilaterally.  OZD:GUYQHip:left ROM IR: 10 degrees with continued resisted motion but nonpainful. Still decreased compared to contralateral side ER: 35 Deg and now symmetric compared to the contralateral side which is an improvement.Flexion: 120 Deg, Extension: 100 Deg, Abduction: 45 Deg, Adduction: 45 Deg Strength IR: 4/5, ER: 5/5, Flexion: 5/5, Extension: 5/5, Abduction: 4+/5, Adduction: 5/5 Pelvic alignment unremarkable to inspection and palpation. Standing hip rotation and gait without trendelenburg sign / unsteadiness. Greater trochanter without tenderness to palpation. No tenderness over piriformis and greater trochanter. Improved range of motion with Pearlean BrownieFaber testing and nontender. Continued mild pain with the grind test. No SI joint tenderness and normal minimal SI movement. Contralateral hip unremarkable      Impression and Recommendations:     This case required medical decision making of  moderate complexity.

## 2015-05-28 NOTE — Patient Instructions (Signed)
Good to see you Happy holidays!  Ice when you need it Have fun on slope See me again in 6 weeks if not better

## 2015-07-04 ENCOUNTER — Other Ambulatory Visit: Payer: Self-pay

## 2015-07-04 DIAGNOSIS — Z1231 Encounter for screening mammogram for malignant neoplasm of breast: Secondary | ICD-10-CM

## 2015-07-12 ENCOUNTER — Ambulatory Visit: Payer: 59 | Admitting: Family

## 2015-07-22 ENCOUNTER — Ambulatory Visit
Admission: RE | Admit: 2015-07-22 | Discharge: 2015-07-22 | Disposition: A | Discharge status: Home / Self Care | Payer: 59 | Source: Ambulatory Visit

## 2015-07-22 DIAGNOSIS — Z1231 Encounter for screening mammogram for malignant neoplasm of breast: Secondary | ICD-10-CM

## 2015-07-25 ENCOUNTER — Encounter: Payer: Self-pay | Admitting: Family

## 2015-09-25 ENCOUNTER — Encounter: Payer: Self-pay | Admitting: Obstetrics and Gynecology

## 2015-09-25 ENCOUNTER — Ambulatory Visit (INDEPENDENT_AMBULATORY_CARE_PROVIDER_SITE_OTHER): Payer: BLUE CROSS/BLUE SHIELD | Admitting: Obstetrics and Gynecology

## 2015-09-25 VITALS — BP 110/68 | HR 74 | Resp 14 | Wt 132.6 lb

## 2015-09-25 DIAGNOSIS — Z3041 Encounter for surveillance of contraceptive pills: Secondary | ICD-10-CM

## 2015-09-25 DIAGNOSIS — Z124 Encounter for screening for malignant neoplasm of cervix: Secondary | ICD-10-CM | POA: Diagnosis not present

## 2015-09-25 DIAGNOSIS — Z01419 Encounter for gynecological examination (general) (routine) without abnormal findings: Secondary | ICD-10-CM | POA: Diagnosis not present

## 2015-09-25 DIAGNOSIS — Z1151 Encounter for screening for human papillomavirus (HPV): Secondary | ICD-10-CM | POA: Diagnosis not present

## 2015-09-25 MED ORDER — LEVONORGESTREL-ETHINYL ESTRAD 0.1-20 MG-MCG PO TABS: 1.0000 | ORAL_TABLET | Freq: Every day | ORAL | Status: DC

## 2015-09-25 NOTE — Patient Instructions (Signed)

## 2015-09-25 NOTE — Telephone Encounter (Signed)
Reviewed with Dr. Oscar LaJertson in her office. Okay as ordered.  .Will close encounter.

## 2015-09-25 NOTE — Progress Notes (Signed)
45 y.o. G2P2 MarriedCaucasianF here for annual exam.  [place]. Sexually active, no pain.  Period Duration (Days): 3 Period Pattern: Regular Menstrual Flow: Light Menstrual Control: Tampon Dysmenorrhea: None  Patient's last menstrual period was 09/18/2015 (exact date).          Sexually active: Yes.    The current method of family planning is OCP (estrogen/progesterone) and oral progesterone-only contraceptive.    Exercising: Yes.    Gym/ health club routine includes yoga. Smoker:  no  Health Maintenance: Pap:  10/12/14 History of abnormal Pap:  Yes, HPV in 2000, colposcopy in 2000, normal paps since. No surgery.  MMG:   MMG 07/25/15 Bi-rads 1, Cat C breast density  Colonoscopy:  N/A BMD:   N/A TDaP:   12/24/2009   reports that she has never smoked. She has never used smokeless tobacco. She reports that she drinks alcohol. She reports that she does not use illicit drugs. Stay at home mom. [place] from [place] she has a Environmental managergraduate degree in [employer]. Kids are 11 and 13.   Past Medical History  Diagnosis Date  . Kidney stones   . Mumps   . Chicken pox   . History of BCG vaccination 01/15/1974, 12/12/1982    History reviewed. No pertinent past surgical history.  Current Outpatient Prescriptions  Medication Sig Dispense Refill  . levonorgestrel-ethinyl estradiol (AVIANE,ALESSE,LESSINA) 0.1-20 MG-MCG tablet Take 1 tablet by mouth daily.    . Multiple Vitamins-Minerals (MULTIVITAMIN & MINERAL PO) Take by mouth.     No current facility-administered medications for this visit.    Family History  Problem Relation Age of Onset  . Hypertension Mother   . Hyperlipidemia Paternal Grandmother   . Hyperlipidemia Paternal Grandfather     Review of Systems  Constitutional: Negative.   HENT: Negative.   Eyes: Negative.   Respiratory: Negative.   Cardiovascular: Negative.   Gastrointestinal: Negative.   Endocrine: Negative.   Genitourinary: Negative.    Musculoskeletal: Negative.   Skin: Negative.   Allergic/Immunologic: Negative.   Neurological: Negative.   Hematological: Negative.   Psychiatric/Behavioral: Negative.     Exam:   BP 110/68 mmHg  Pulse 74  Resp 14  Wt 132 lb 9.6 oz (60.147 kg)  LMP 09/18/2015 (Exact Date)  Weight change: @WEIGHTCHANGE @ Height:      Ht Readings from Last 3 Encounters:  05/28/15 5\' 7"  (1.702 m)  04/16/15 5\' 7"  (1.702 m)  03/19/15 5\' 7"  (1.702 m)    General appearance: alert, cooperative and appears stated age Head: Normocephalic, without obvious abnormality, atraumatic Neck: no adenopathy, supple, symmetrical, trachea midline and thyroid normal to inspection and palpation Lungs: clear to auscultation bilaterally Breasts: normal appearance, no masses or tenderness Heart: regular rate and rhythm Abdomen: soft, non-tender; bowel sounds normal; no masses,  no organomegaly Extremities: extremities normal, atraumatic, no cyanosis or edema Skin: Skin color, texture, turgor normal. No rashes or lesions Lymph nodes: Cervical, supraclavicular, and axillary nodes normal. No abnormal inguinal nodes palpated Neurologic: Grossly normal   Pelvic: External genitalia:  no lesions              Urethra:  normal appearing urethra with no masses, tenderness or lesions              Bartholins and Skenes: normal                 Vagina: normal appearing vagina with normal color and discharge, no lesions  Cervix: no lesions               Bimanual Exam:  Uterus:  normal size, contour, position, consistency, mobility, non-tender and anteverted              Adnexa: no mass, fullness, tenderness               Rectovaginal: Confirms               Anus:  normal sphincter tone, no lesions  Chaperone was present for exam.  A:  Well Woman with normal exam   Dense breasts  P:   Pap with hpv  Mammogram just done, recommend a 3D next year  Discussed breast self exam  Discussed calcium and vit D  intake  Labs and immunizations with primary  Continue OCP's

## 2015-09-27 LAB — IPS PAP TEST WITH HPV

## 2015-10-09 ENCOUNTER — Encounter: Payer: Self-pay | Admitting: Family

## 2015-10-21 ENCOUNTER — Telehealth: Payer: Self-pay | Admitting: *Deleted

## 2015-10-21 NOTE — Telephone Encounter (Signed)
Fax from [facility] states: Levonorgestrel-ethinyl estradiol 0.1-20 mg is available in two distinct A1 and A2 formulations. A1 formulation is equivalent to brand name Aviane or Alesse and A2 formulation is equivalent to brand name Lessina. Please indicate which formulation is to be processed.  Norethindrone equivalent to aviane brand (A1) Norethindrone equivalent to [place] brand (A2)  Dr. Oscar LaJertson please advise so I can let [facility] know.

## 2015-10-21 NOTE — Telephone Encounter (Signed)
Please send in the A1, equivalent to Aviane.

## 2015-10-23 NOTE — Telephone Encounter (Signed)
Called [facility] and s/w pharmacist "Benish" and confirmed that Aviane brand is okay for patient.  Routed to provider for review, encounter closed.

## 2016-05-29 DIAGNOSIS — H02402 Unspecified ptosis of left eyelid: Secondary | ICD-10-CM | POA: Diagnosis not present

## 2016-05-29 DIAGNOSIS — H04123 Dry eye syndrome of bilateral lacrimal glands: Secondary | ICD-10-CM | POA: Diagnosis not present

## 2016-05-29 DIAGNOSIS — H10413 Chronic giant papillary conjunctivitis, bilateral: Secondary | ICD-10-CM | POA: Diagnosis not present

## 2016-06-25 ENCOUNTER — Other Ambulatory Visit: Payer: Self-pay | Admitting: Family

## 2016-06-25 DIAGNOSIS — Z1231 Encounter for screening mammogram for malignant neoplasm of breast: Secondary | ICD-10-CM

## 2016-07-27 ENCOUNTER — Ambulatory Visit
Admission: RE | Admit: 2016-07-27 | Discharge: 2016-07-27 | Disposition: A | Discharge status: Home / Self Care | Payer: BLUE CROSS/BLUE SHIELD | Source: Ambulatory Visit | Attending: Family | Admitting: Family

## 2016-07-27 DIAGNOSIS — Z1231 Encounter for screening mammogram for malignant neoplasm of breast: Secondary | ICD-10-CM

## 2016-07-29 ENCOUNTER — Telehealth: Payer: Self-pay | Admitting: Obstetrics and Gynecology

## 2016-07-30 NOTE — Telephone Encounter (Signed)
Medication refill request: Levonorgetrel-Ethinyl Estradiol Last AEX:  09/25/15 JJ Next AEX: 10/01/15 JJ Last MMG (if hormonal medication request): 07/27/16 BIRADS1, Density C, TBC Refill authorized: 09/25/15 #3 Package 3R. Please advise. Thank you.

## 2016-07-31 NOTE — Telephone Encounter (Signed)
A 3 month supply of the Aviane was sent yesterday, A1 if clarification is needed.

## 2016-07-31 NOTE — Telephone Encounter (Signed)
Fax from [facility] states:  Levonorgestrel-ethinyl estradiol 0.1-20 mg is available in two distinct A1 and A2 formulations. A1 formulation is equivalent to brand name Aviane or Alesse and A2 formulation is equivalent to brand name Lessina. Please indicate which formulation is to be processed.  Norethindrone equivalent to aviane brand (A1) Norethindrone equivalent to [place] brand (A2)  Please advise. Thanks.

## 2016-08-03 NOTE — Telephone Encounter (Signed)
[facility] has been notified. Closing encounter

## 2016-09-30 ENCOUNTER — Ambulatory Visit (INDEPENDENT_AMBULATORY_CARE_PROVIDER_SITE_OTHER): Payer: BLUE CROSS/BLUE SHIELD | Admitting: Obstetrics and Gynecology

## 2016-09-30 ENCOUNTER — Encounter: Payer: Self-pay | Admitting: Obstetrics and Gynecology

## 2016-09-30 VITALS — BP 102/60 | HR 72 | Resp 16 | Ht 66.0 in | Wt 135.0 lb

## 2016-09-30 DIAGNOSIS — Z01419 Encounter for gynecological examination (general) (routine) without abnormal findings: Secondary | ICD-10-CM | POA: Diagnosis not present

## 2016-09-30 DIAGNOSIS — Z3041 Encounter for surveillance of contraceptive pills: Secondary | ICD-10-CM

## 2016-09-30 MED ORDER — LEVONORGESTREL-ETHINYL ESTRAD 0.1-20 MG-MCG PO TABS: 1.0000 | ORAL_TABLET | Freq: Every day | ORAL | 3 refills | Status: DC

## 2016-09-30 NOTE — Patient Instructions (Signed)

## 2016-09-30 NOTE — Addendum Note (Signed)
Addended by: Tobi Bastos on: 09/30/2016 09:08 AM   Modules accepted: Orders

## 2016-09-30 NOTE — Progress Notes (Signed)
45 y.o. G2P2 MarriedCaucasianF here for annual exam.   Period Cycle (Days): 28 Period Duration (Days): 4 days  Period Pattern: Regular Menstrual Flow: Light Menstrual Control: Tampon, Panty liner Menstrual Control Change Freq (Hours): changes tampon every 6 hours  Dysmenorrhea: None  She reports occasional spotting in between her cycles. Does occasionally miss or take her pills late. No dyspareunia. Decreased libido, harder to achieve an orgasm.  She has family visiting this summer from [place].   Patient's last menstrual period was 09/23/2016.          Sexually active: Yes.    The current method of family planning is OCP (estrogen/progesterone).    Exercising: Yes.    yoga/purebar  Smoker:  no  Health Maintenance: Pap:  09-25-15 WNL NEG HR HPV History of abnormal Pap:  Yes -2000 +HR HPV colposcopy- benign  MMG:  07-27-16 WNL, dense cat C Colonoscopy:  Never BMD:   Never TDaP:  12-24-09 Gardasil: N/A   reports that she has never smoked. She has never used smokeless tobacco. She reports that she drinks alcohol. She reports that she does not use drugs. Stay at home mom. 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 Kids are 55 (boy) and 59 (girl). Kids go to [employer], 6 and 8 grade.    Past Medical History:  Diagnosis Date  . Chicken pox   . History of BCG vaccination 01/15/1974, 12/12/1982  . Kidney stones   . Mumps     Past Surgical History:  Procedure Laterality Date  . COLPOSCOPY      Current Outpatient Prescriptions  Medication Sig Dispense Refill  . levonorgestrel-ethinyl estradiol (AVIANE,ALESSE,LESSINA) 0.1-20 MG-MCG tablet TAKE 1 TABLET BY MOUTH  DAILY 84 tablet 0  . Multiple Vitamins-Minerals (MULTIVITAMIN & MINERAL PO) Take by mouth.     No current facility-administered medications for this visit.     Family History  Problem Relation Age of Onset  . Hypertension Mother   . Hyperlipidemia Paternal Grandmother   .  Hyperlipidemia Paternal Grandfather     Review of Systems  Constitutional: Negative.   HENT: Negative.   Eyes: Negative.   Respiratory: Negative.   Cardiovascular: Negative.   Gastrointestinal: Negative.   Endocrine: Negative.   Genitourinary: Negative.        Break through spotting   Musculoskeletal: Negative.   Skin: Negative.   Allergic/Immunologic: Negative.   Neurological: Negative.   Psychiatric/Behavioral: Negative.     Exam:   BP 102/60 (BP Location: Right Arm, Patient Position: Sitting, Cuff Size: Normal)   Pulse 72   Resp 16   Ht  (1.676 m)   Wt 135 lb (61.2 kg)   LMP 09/23/2016   BMI 21.79 kg/m   Weight change: @ Height:   Height:  (167.6 cm)  Ht Readings from Last 3 Encounters:  09/30/16  (1.676 m)  05/28/15  (1.702 m)  04/16/15  (1.702 m)    General appearance: alert, cooperative and appears stated age Head: Normocephalic, without obvious abnormality, atraumatic Neck: no adenopathy, supple, symmetrical, trachea midl83ine and thyroid normal to inspection and palpation Lungs: clear to auscultation bilaterally Cardiovascular: regular rate and rhythm Breasts: normal appearance, no masses or tenderness Abdomen: soft, non-tender; bowel sounds normal; no masses,  no organomegaly Extremities: extremities normal, atraumatic, no cyanosis or edema Skin: Skin color, texture, turgor normal. No rashes or lesions Lymph nodes: Cervical, supraclavicular, and axillary nodes normal. No abnormal inguinal nodes palpated Neurologic: Grossly  normal   Pelvic: External genitalia:  no lesions              Urethra:  normal appearing urethra with no masses, tenderness or lesions              Bartholins and Skenes: normal                 Vagina: normal appearing vagina with normal color and discharge, no lesions              Cervix: no lesions               Bimanual Exam:  Uterus:  normal size, contour, position, consistency, mobility,  non-tender and anteverted              Adnexa: no mass, fullness, tenderness               Rectovaginal: Confirms               Anus:  normal sphincter tone, no lesions  Chaperone was present for exam.  A:  Well Woman with normal exam  Decreased libido, more difficulty achieving orgasm. Information given and discussed  P:   No pap this year  Labs with primary  Discussed breast self exam  Discussed calcium and vit D intake  Continue OCP's

## 2016-10-12 ENCOUNTER — Other Ambulatory Visit: Payer: Self-pay | Admitting: Obstetrics and Gynecology

## 2016-10-14 ENCOUNTER — Telehealth: Payer: Self-pay | Admitting: Obstetrics and Gynecology

## 2016-10-14 NOTE — Telephone Encounter (Signed)
Spoke with [facility]. Pharmacy wanted to know why Aviane refill request was refused with the comment that it was already sent to them. Aviane sent 09/30/16 to [facility] during OV. Spoke with patient to confirm which pharmacy she would like to fill rx. Patient states she would like Aviane to be processed with mail order. Aviane called to [facility] and cancelled with [facility].

## 2016-10-14 NOTE — Telephone Encounter (Signed)
Pharmacy is calling to get clarification on Aviane tablets.

## 2017-07-06 ENCOUNTER — Other Ambulatory Visit: Payer: Self-pay | Admitting: Family

## 2017-07-06 DIAGNOSIS — Z1231 Encounter for screening mammogram for malignant neoplasm of breast: Secondary | ICD-10-CM

## 2017-07-28 ENCOUNTER — Ambulatory Visit
Admission: RE | Admit: 2017-07-28 | Discharge: 2017-07-28 | Disposition: A | Discharge status: Home / Self Care | Payer: BLUE CROSS/BLUE SHIELD | Source: Ambulatory Visit | Attending: Family | Admitting: Family

## 2017-07-28 DIAGNOSIS — Z1231 Encounter for screening mammogram for malignant neoplasm of breast: Secondary | ICD-10-CM | POA: Diagnosis not present

## 2017-08-10 ENCOUNTER — Other Ambulatory Visit: Payer: Self-pay | Admitting: Obstetrics and Gynecology

## 2017-08-11 NOTE — Telephone Encounter (Signed)
Medication refill request: Vienva #[id number] Last AEX:  09-30-16 Next AEX: 10-06-17 Last MMG (if hormonal medication request): 08-07-17 Neg/[email or web address]  Refill authorized: Please advise

## 2017-10-06 ENCOUNTER — Encounter: Payer: Self-pay | Admitting: Obstetrics and Gynecology

## 2017-10-06 ENCOUNTER — Ambulatory Visit (INDEPENDENT_AMBULATORY_CARE_PROVIDER_SITE_OTHER): Payer: BLUE CROSS/BLUE SHIELD | Admitting: Obstetrics and Gynecology

## 2017-10-06 ENCOUNTER — Other Ambulatory Visit: Payer: Self-pay

## 2017-10-06 VITALS — BP 110/70 | HR 80 | Resp 14 | Ht 66.0 in | Wt 136.0 lb

## 2017-10-06 DIAGNOSIS — Z1211 Encounter for screening for malignant neoplasm of colon: Secondary | ICD-10-CM | POA: Diagnosis not present

## 2017-10-06 DIAGNOSIS — Z01419 Encounter for gynecological examination (general) (routine) without abnormal findings: Secondary | ICD-10-CM

## 2017-10-06 DIAGNOSIS — Z3041 Encounter for surveillance of contraceptive pills: Secondary | ICD-10-CM | POA: Diagnosis not present

## 2017-10-06 MED ORDER — LEVONORGESTREL-ETHINYL ESTRAD 0.1-20 MG-MCG PO TABS: 1.0000 | ORAL_TABLET | Freq: Every day | ORAL | 3 refills | Status: DC

## 2017-10-06 NOTE — Progress Notes (Signed)
47 y.o. G2P2 MarriedCaucasianF here for annual exam.  No dyspareunia. Period Cycle (Days): 28 Period Duration (Days): 3 days  Period Pattern: Regular Menstrual Flow: Light Menstrual Control: Tampon Menstrual Control Change Freq (Hours): changes tampon 2 -4 times a days  Dysmenorrhea: None  She c/o an intermittent discomfort in her RLQ. She will pay attention to it. Not sure if it is related to bowels. BM qd. No urinary c/o.   Patient's last menstrual period was 09/22/2017.          Sexually active: Yes.    The current method of family planning is OCP (estrogen/progesterone).    Exercising: Yes.   Pure barre/ yoga  Smoker:  no  Health Maintenance: Pap:  09-25-15 WNL NEG HR HPV  History of abnormal Pap:  Yes 2000 had colposcopy  MMG:  07-28-17 WNL Colonoscopy:  Never BMD:   Never TDaP:  12-24-09 Gardasil: N/A   reports that she has never smoked. She has never used smokeless tobacco. She reports that she drinks about 2.4 oz of alcohol per week. She reports that she does not use drugs. [place] from [place], she has a Environmental managergraduate degree in [employer]. Kids are 3913 (boy) and 2515 (girl). Kids go to [employer], 7 and 9 grade.     Past Medical History:  Diagnosis Date  . Chicken pox   . History of BCG vaccination 01/15/1974, 12/12/1982  . Kidney stones   . Mumps     Past Surgical History:  Procedure Laterality Date  . COLPOSCOPY      Current Outpatient Medications  Medication Sig Dispense Refill  . Multiple Vitamins-Minerals (MULTIVITAMIN & MINERAL PO) Take by mouth.    Marland Kitchen. VIENVA 0.1-20 MG-MCG tablet TAKE 1 TABLET BY MOUTH  DAILY 84 tablet 0   No current facility-administered medications for this visit.     Family History  Problem Relation Age of Onset  . Hypertension Mother   . Hyperlipidemia Paternal Grandmother   . Hyperlipidemia Paternal Grandfather     Review of Systems  Constitutional: Negative.   HENT: Negative.   Eyes: Negative.   Respiratory:  Negative.   Cardiovascular: Negative.   Gastrointestinal: Negative.   Endocrine: Negative.   Genitourinary: Negative.   Musculoskeletal: Negative.   Skin: Negative.   Allergic/Immunologic: Negative.   Neurological: Negative.   Psychiatric/Behavioral: Negative.     Exam:   BP 110/70 (BP Location: Right Arm, Patient Position: Sitting, Cuff Size: Normal)   Pulse 80   Resp 14   Ht 5\' 6"  (1.676 m)   Wt 136 lb (61.7 kg)   LMP 09/22/2017   BMI 21.95 kg/m   Weight change: @WEIGHTCHANGE @ Height:   Height: 5\' 6"  (167.6 cm)  Ht Readings from Last 3 Encounters:  10/06/17 5\' 6"  (1.676 m)  09/30/16 5\' 6"  (1.676 m)  05/28/15 5\' 7"  (1.702 m)    General appearance: alert, cooperative and appears stated age Head: Normocephalic, without obvious abnormality, atraumatic Neck: no adenopathy, supple, symmetrical, trachea midline and thyroid normal to inspection and palpation Lungs: clear to auscultation bilaterally Cardiovascular: regular rate and rhythm Breasts: normal appearance, no masses or tenderness Abdomen: soft, non-tender; non distended,  no masses,  no organomegaly Extremities: extremities normal, atraumatic, no cyanosis or edema Skin: Skin color, texture, turgor normal. No rashes or lesions Lymph nodes: Cervical, supraclavicular, and axillary nodes normal. No abnormal inguinal nodes palpated Neurologic: Grossly normal   Pelvic: External genitalia:  no lesions  Urethra:  normal appearing urethra with no masses, tenderness or lesions              Bartholins and Skenes: normal                 Vagina: normal appearing vagina with normal color and discharge, no lesions              Cervix: no lesions               Bimanual Exam:  Uterus:  normal size, contour, position, consistency, mobility, non-tender and anteverted              Adnexa: no mass, fullness, tenderness               Rectovaginal: Confirms               Anus:  normal sphincter tone, no lesions  Chaperone  was present for exam.  A:  Well Woman with normal exam  P:   Pap next year  She will do labs with her primary  IFOB given  Discussed breast self exam  Discussed calcium and vit D intake  Continue OCP's

## 2017-10-06 NOTE — Patient Instructions (Signed)

## 2017-12-31 ENCOUNTER — Encounter: Payer: Self-pay | Admitting: Family Medicine

## 2017-12-31 ENCOUNTER — Ambulatory Visit (INDEPENDENT_AMBULATORY_CARE_PROVIDER_SITE_OTHER): Payer: BLUE CROSS/BLUE SHIELD | Admitting: Family Medicine

## 2017-12-31 VITALS — BP 110/68 | HR 73 | Temp 98.6°F | Ht 66.0 in | Wt 135.6 lb

## 2017-12-31 DIAGNOSIS — Z13220 Encounter for screening for lipoid disorders: Secondary | ICD-10-CM | POA: Diagnosis not present

## 2017-12-31 DIAGNOSIS — R202 Paresthesia of skin: Secondary | ICD-10-CM

## 2017-12-31 DIAGNOSIS — H02402 Unspecified ptosis of left eyelid: Secondary | ICD-10-CM | POA: Diagnosis not present

## 2017-12-31 DIAGNOSIS — Z114 Encounter for screening for human immunodeficiency virus [HIV]: Secondary | ICD-10-CM | POA: Diagnosis not present

## 2017-12-31 LAB — COMPREHENSIVE METABOLIC PANEL
ALT: 15 U/L (ref 0–35)
AST: 10 U/L (ref 0–37)
Albumin: 4.7 g/dL (ref 3.5–5.2)
Alkaline Phosphatase: 36 U/L — ABNORMAL LOW (ref 39–117)
BUN: 11 mg/dL (ref 6–23)
CO2: 28 mEq/L (ref 19–32)
Calcium: 10 mg/dL (ref 8.4–10.5)
Chloride: 102 mEq/L (ref 96–112)
Creatinine, Ser: 0.76 mg/dL (ref 0.40–1.20)
GFR: 87.09 mL/min (ref >60.00)
Glucose, Bld: 92 mg/dL (ref 70–99)
Potassium: 4.7 mEq/L (ref 3.5–5.1)
Sodium: 137 mEq/L (ref 135–145)
Total Bilirubin: 0.5 mg/dL (ref 0.2–1.2)
Total Protein: 7.2 g/dL (ref 6.0–8.3)

## 2017-12-31 LAB — LIPID PANEL
Cholesterol: 167 mg/dL (ref 0–200)
HDL: 47.8 mg/dL (ref >39.00)
LDL Cholesterol: 95 mg/dL (ref 0–99)
NonHDL: 119.48
Total CHOL/HDL Ratio: 3
Triglycerides: 123 mg/dL (ref 0.0–149.0)
VLDL: 24.6 mg/dL (ref 0.0–40.0)

## 2017-12-31 LAB — CBC WITH DIFFERENTIAL/PLATELET
Basophils Absolute: 0.1 10*3/uL (ref 0.0–0.1)
Basophils Relative: 1.2 % (ref 0.0–3.0)
Eosinophils Absolute: 0.1 10*3/uL (ref 0.0–0.7)
Eosinophils Relative: 1.5 % (ref 0.0–5.0)
HCT: 44.5 % (ref 36.0–46.0)
Hemoglobin: 15 g/dL (ref 12.0–15.0)
Lymphocytes Relative: 37.1 % (ref 12.0–46.0)
Lymphs Abs: 2.9 10*3/uL (ref 0.7–4.0)
MCHC: 33.8 g/dL (ref 30.0–36.0)
MCV: 93.3 fl (ref 78.0–100.0)
Monocytes Absolute: 0.5 10*3/uL (ref 0.1–1.0)
Monocytes Relative: 6.2 % (ref 3.0–12.0)
Neutro Abs: 4.2 10*3/uL (ref 1.4–7.7)
Neutrophils Relative %: 54 % (ref 43.0–77.0)
Platelets: 330 10*3/uL (ref 150.0–400.0)
RBC: 4.77 Mil/uL (ref 3.87–5.11)
RDW: 12.5 % (ref 11.5–15.5)
WBC: 7.7 10*3/uL (ref 4.0–10.5)

## 2017-12-31 LAB — TSH: TSH: 1.34 u[IU]/mL (ref 0.35–4.50)

## 2017-12-31 LAB — VITAMIN B12: Vitamin B-12: 437 pg/mL (ref 211–911)

## 2017-12-31 NOTE — Progress Notes (Signed)
Connie Robinson is a 47 y.o. female is here to [employer].   Patient Care Team: Helane Rima, DO as PCP - General (Family Medicine)   History of Present Illness:   Connie Robinson, CMA acting as scribe for Dr. Helane Rima.   HPI: Patient in to establish care. She has had drooping of left eye lid for 2-3 years off and on. Seams to be worse with allergies.   Review of Systems  Constitutional: Negative for chills, diaphoresis, fever, malaise/fatigue and weight loss.  HENT: Negative for hearing loss.   Eyes: Negative for blurred vision.  Respiratory: Negative for cough, shortness of breath and wheezing.   Cardiovascular: Negative for chest pain, palpitations and leg swelling.  Gastrointestinal: Negative for abdominal pain, constipation, diarrhea, heartburn, nausea and vomiting.  Genitourinary: Negative for dysuria, flank pain, frequency, hematuria and urgency.  Musculoskeletal: Negative for joint pain and myalgias.  Skin: Negative for rash.  Neurological: Negative for dizziness, tingling, tremors, sensory change, speech change, focal weakness, seizures, loss of consciousness, weakness and headaches.  Psychiatric/Behavioral: Negative for depression, substance abuse and suicidal ideas. The patient is not nervous/anxious and does not have insomnia.    Health Maintenance Due  Topic Date Due  . HIV Screening  01/12/1987   Depression screen PHQ 2/9 01/01/2018  Decreased Interest 0  Down, Depressed, Hopeless 0  PHQ - 2 Score 0    PMHx, SurgHx, SocialHx, Medications, and Allergies were reviewed in the Visit Navigator and updated as appropriate.   Past Medical History:  Diagnosis Date  . Chicken pox   . History of BCG vaccination 01/15/1974, 12/12/1982  . Kidney stones   . Mumps      Past Surgical History:  Procedure Laterality Date  . COLPOSCOPY  2000     Family History  Problem Relation Age of Onset  . Hypertension Mother   . Hyperlipidemia Paternal  Grandmother   . Hyperlipidemia Paternal Grandfather     Social History   Tobacco Use  . Smoking status: Never Smoker  . Smokeless tobacco: Never Used  Substance Use Topics  . Alcohol use: Yes    Alcohol/week: 2.4 oz    Types: 4 Standard drinks or equivalent per week  . Drug use: No    Current Medications and Allergies:   Current Outpatient Medications:  .  calcium citrate-vitamin D (CITRACAL+D) 315-200 MG-UNIT tablet, Take 1 tablet by mouth 2 (two) times daily., Disp: , Rfl:  .  levonorgestrel-ethinyl estradiol (VIENVA) 0.1-20 MG-MCG tablet, Take 1 tablet by mouth daily., Disp: 84 tablet, Rfl: 3 .  Multiple Vitamins-Minerals (MULTIVITAMIN & MINERAL PO), Take by mouth., Disp: , Rfl:   No Known Allergies   Review of Systems:   Pertinent items are noted in the HPI. Otherwise, ROS is negative.  Vitals:   Vitals:   12/31/17 1404  BP: 110/68  Pulse: 73  Temp: 98.6 F (37 C)  TempSrc: Oral  SpO2: 98%  Weight: 135 lb 9.6 oz (61.5 kg)  Height: 5\' 6"  (1.676 m)     Body mass index is 21.89 kg/m.  Physical Exam:   Physical Exam  Constitutional: She is oriented to person, place, and time. She appears well-developed and well-nourished. No distress.  HENT:  Head: Normocephalic and atraumatic.  Right Ear: External ear normal.  Left Ear: External ear normal.  Nose: Nose normal.  Mouth/Throat: Oropharynx is clear and moist.  Eyes: Pupils are equal, round, and reactive to light. Conjunctivae and EOM are normal.  Mild left ptosis, covers  pupil slightly.   Neck: Normal range of motion. Neck supple. No thyromegaly present.  Cardiovascular: Normal rate, regular rhythm, normal heart sounds and intact distal pulses.  Pulmonary/Chest: Effort normal and breath sounds normal.  Abdominal: Soft. Bowel sounds are normal.  Musculoskeletal: Normal range of motion.  Lymphadenopathy:    She has no cervical adenopathy.  Neurological: She is alert and oriented to person, place, and time.    Skin: Skin is warm and dry. Capillary refill takes less than 2 seconds.  Psychiatric: She has a normal mood and affect. Her behavior is normal.  Nursing note and vitals reviewed.   Results for orders placed or performed in visit on 12/31/17  CBC with Differential/Platelet  Result Value Ref Range   WBC 7.7 4.0 - 10.5 K/uL   RBC 4.77 3.87 - 5.11 Mil/uL   Hemoglobin 15.0 12.0 - 15.0 g/dL   HCT 16.1 09.6 - 04.5 %   MCV 93.3 78.0 - 100.0 fl   MCHC 33.8 30.0 - 36.0 g/dL   RDW 40.9 81.1 - 91.4 %   Platelets 330.0 150.0 - 400.0 K/uL   Neutrophils Relative % 54.0 43.0 - 77.0 %   Lymphocytes Relative 37.1 12.0 - 46.0 %   Monocytes Relative 6.2 3.0 - 12.0 %   Eosinophils Relative 1.5 0.0 - 5.0 %   Basophils Relative 1.2 0.0 - 3.0 %   Neutro Abs 4.2 1.4 - 7.7 K/uL   Lymphs Abs 2.9 0.7 - 4.0 K/uL   Monocytes Absolute 0.5 0.1 - 1.0 K/uL   Eosinophils Absolute 0.1 0.0 - 0.7 K/uL   Basophils Absolute 0.1 0.0 - 0.1 K/uL  Comprehensive metabolic panel  Result Value Ref Range   Sodium 137 135 - 145 mEq/L   Potassium 4.7 3.5 - 5.1 mEq/L   Chloride 102 96 - 112 mEq/L   CO2 28 19 - 32 mEq/L   Glucose, Bld 92 70 - 99 mg/dL   BUN 11 6 - 23 mg/dL   Creatinine, Ser 7.82 0.40 - 1.20 mg/dL   Total Bilirubin 0.5 0.2 - 1.2 mg/dL   Alkaline Phosphatase 36 (L) 39 - 117 U/L   AST 10 0 - 37 U/L   ALT 15 0 - 35 U/L   Total Protein 7.2 6.0 - 8.3 g/dL   Albumin 4.7 3.5 - 5.2 g/dL   Calcium 95.6 8.4 - 21.3 mg/dL   GFR 08.65 >78.46 mL/min  Lipid panel  Result Value Ref Range   Cholesterol 167 0 - 200 mg/dL   Triglycerides 962.9 0.0 - 149.0 mg/dL   HDL 52.84 >13.24 mg/dL   VLDL 40.1 0.0 - 02.7 mg/dL   LDL Cholesterol 95 0 - 99 mg/dL   Total CHOL/HDL Ratio 3    NonHDL 119.48   HIV antibody  Result Value Ref Range   HIV 1&2 Ab, 4th Generation NON-REACTIVE NON-REACTI  TSH  Result Value Ref Range   TSH 1.34 0.35 - 4.50 uIU/mL  Vitamin B12  Result Value Ref Range   Vitamin B-12 437 211 - 911 pg/mL     Assessment and Plan:   Connie Robinson was seen today for establish care.  Diagnoses and all orders for this visit:  Ptosis of eyelid, left, chronic but worsening Comments: Will refer to Ophthalmology for evaluation and possible repair. No red flags today - unilateral, pupil sparing, without other neurological concerns.  Orders: -     CBC with Differential/Platelet -     Comprehensive metabolic panel -     Vitamin  B12 -     Ambulatory referral to Ophthalmology  Right leg paresthesias Comments: Intermittent, mild. Labs pending.  Orders: -     CBC with Differential/Platelet -     Comprehensive metabolic panel -     TSH -     Vitamin B12  Screening for HIV (human immunodeficiency virus) -     HIV antibody  Screening for lipid disorders -     Lipid panel    . Reviewed expectations re: course of current medical issues. . Discussed self-management of symptoms. . Outlined signs and symptoms indicating need for more acute intervention. . Patient verbalized understanding and all questions were answered. Marland Kitchen. Health Maintenance issues including appropriate healthy diet, exercise, and smoking avoidance were discussed with patient. . See orders for this visit as documented in the electronic medical record. . Patient received an After Visit Summary.  CMA served as Neurosurgeonscribe during this visit. History, Physical, and Plan performed by medical provider. The above documentation has been reviewed and is accurate and complete. Helane RimaErica Zakir Henner, D.O.   Helane RimaErica Samson Ralph, DO [place], [facility] 01/01/2018

## 2018-01-01 ENCOUNTER — Encounter: Payer: Self-pay | Admitting: Family Medicine

## 2018-01-01 LAB — HIV ANTIBODY (ROUTINE TESTING W REFLEX): HIV 1&2 Ab, 4th Generation: NONREACTIVE

## 2018-08-09 ENCOUNTER — Other Ambulatory Visit: Payer: Self-pay | Admitting: Family Medicine

## 2018-08-09 DIAGNOSIS — Z1231 Encounter for screening mammogram for malignant neoplasm of breast: Secondary | ICD-10-CM

## 2018-08-11 ENCOUNTER — Other Ambulatory Visit: Payer: Self-pay | Admitting: Obstetrics and Gynecology

## 2018-08-27 ENCOUNTER — Other Ambulatory Visit: Payer: Self-pay | Admitting: Obstetrics and Gynecology

## 2018-08-29 NOTE — Telephone Encounter (Signed)
3 month script sent. Please remind patient to set up her mammogram.

## 2018-08-29 NOTE — Telephone Encounter (Signed)
Medication refill request: Clovis Fredrickson Last AEX:  10/06/17 JJ Next AEX: 11/02/18 Last MMG (if hormonal medication request): 07/28/17 BIRADS 1 negative/density c -- scheduled 09/08/18 Refill authorized: 10/06/17 #84 w/3 refills; today please advise, order pended for #3 w/0 refills to last patient until AEX

## 2018-08-29 NOTE — Telephone Encounter (Signed)
Patient is scheduled 09/08/18 at [facility]

## 2018-09-08 ENCOUNTER — Ambulatory Visit: Payer: BLUE CROSS/BLUE SHIELD

## 2018-10-18 ENCOUNTER — Other Ambulatory Visit: Payer: Self-pay | Admitting: Obstetrics and Gynecology

## 2018-10-18 NOTE — Telephone Encounter (Signed)
Medication refill request: Connie Robinson 0.1-20mg -mcg Last AEX:  10-11-17 Next AEX: 11-02-2018 Last MMG (if hormonal medication request): 07-28-17 category c density birads 1:neg Refill authorized: please approve if appropriate.

## 2018-10-19 ENCOUNTER — Ambulatory Visit: Payer: BLUE CROSS/BLUE SHIELD

## 2018-10-31 NOTE — Progress Notes (Signed)
48 y.o. G2P2 Married White or Caucasian Not Hispanic or Latino female here for annual exam.   She has gained 5 lbs in the last year. Feels her appetite has increased, exercising. With Covid she is working from home, cooking more.   Period Cycle (Days): 28 Period Duration (Days): 3 days Period Pattern: Regular Menstrual Flow: Light Menstrual Control: Tampon Menstrual Control Change Freq (Hours): changes tampon every 4 hours Dysmenorrhea: None  Sexually active, no pain. No vasomotor symptoms. Not sleeping as well, but has 2 cats. Wakes up more at night.   Patient's last menstrual period was 10/19/2018 (approximate).          Sexually active: Yes.    The current method of family planning is OCP (estrogen/progesterone).    Exercising: Yes.    pure barre and yoga Smoker:  no  Health Maintenance: Pap:  09-25-15 WNL NEG HR HPV  History of abnormal Pap:  Yes 2000 had colposcopy  MMG:  Birads 1 negative, next appointment in 6/20 Colonoscopy:  Never BMD:   Never TDaP:  12-24-09 Gardasil: N/A   reports that she has never smoked. She has never used smokeless tobacco. She reports current alcohol use. She reports that she does not use drugs. 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 Kids are 14(boy)and 16(girl).Kids go to [employer], 7 and 9 grade.  Past Medical History:  Diagnosis Date  . Chicken pox   . History of BCG vaccination 01/15/1974, 12/12/1982  . Kidney stones   . Mumps     Past Surgical History:  Procedure Laterality Date  . COLPOSCOPY  2000    Current Outpatient Medications  Medication Sig Dispense Refill  . calcium citrate-vitamin D (CITRACAL+D) 315-200 MG-UNIT tablet Take 1 tablet by mouth 2 (two) times daily.    Marland Kitchen LARISSIA 0.1-20 MG-MCG tablet TAKE 1 TABLET BY MOUTH  DAILY 84 tablet 0  . Multiple Vitamins-Minerals (MULTIVITAMIN & MINERAL PO) Take by mouth.     No current facility-administered medications for this visit.      Family History  Problem Relation Age of Onset  . Hypertension Mother   . Hyperlipidemia Paternal Grandmother   . Hyperlipidemia Paternal Grandfather     Review of Systems  Constitutional: Positive for appetite change.       Weight gain  HENT: Negative.   Eyes: Negative.   Respiratory: Negative.   Cardiovascular: Negative.   Gastrointestinal: Negative.   Endocrine: Negative.   Genitourinary: Negative.   Musculoskeletal: Negative.   Skin: Negative.   Allergic/Immunologic: Negative.   Neurological: Negative.   Hematological: Negative.   Psychiatric/Behavioral: Negative.     Exam:   BP 128/88 (BP Location: Left Arm, Patient Position: Sitting, Cuff Size: Normal)   Pulse 80   Temp 98.2 F (36.8 C) (Skin)   Ht 5\' 6"  (1.676 m)   Wt 141 lb 3.2 oz (64 kg)   LMP 10/19/2018 (Approximate)   BMI 22.79 kg/m   Weight change: @WEIGHTCHANGE @ Height:   Height: 5\' 6"  (167.6 cm)  Ht Readings from Last 3 Encounters:  11/02/18 5\' 6"  (1.676 m)  12/31/17 5\' 6"  (1.676 m)  10/06/17 5\' 6"  (1.676 m)    General appearance: alert, cooperative and appears stated age Head: Normocephalic, without obvious abnormality, atraumatic Neck: no adenopathy, supple, symmetrical, trachea midline and thyroid normal to inspection and palpation Lungs: clear to auscultation bilaterally Cardiovascular: regular rate and rhythm Breasts: normal appearance, no masses or tenderness Abdomen: soft, non-tender; non distended,  no masses,  no organomegaly Extremities: extremities normal, atraumatic, no cyanosis or edema Skin: Skin color, texture, turgor normal. No rashes or lesions Lymph nodes: Cervical, supraclavicular, and axillary nodes normal. No abnormal inguinal nodes palpated Neurologic: Grossly normal   Pelvic: External genitalia:  no lesions              Urethra:  normal appearing urethra with no masses, tenderness or lesions              Bartholins and Skenes: normal                 Vagina: normal  appearing vagina with normal color and discharge, no lesions              Cervix: no lesions               Bimanual Exam:  Uterus:  normal size, contour, position, consistency, mobility, non-tender              Adnexa: no mass, fullness, tenderness               Rectovaginal: Confirms               Anus:  normal sphincter tone, no lesions  Chaperone was present for exam.  A:  Well Woman with normal exam  Initial BP slightly elevated, repeat 126/82  P:   Pap with hpv  Cologuard  Mammogram next month  Discussed breast self exam  Discussed calcium and vit D intake  Labs with primary MD  Will continue OCP's

## 2018-11-01 ENCOUNTER — Other Ambulatory Visit: Payer: Self-pay

## 2018-11-02 ENCOUNTER — Other Ambulatory Visit (HOSPITAL_COMMUNITY)
Admission: RE | Admit: 2018-11-02 | Discharge: 2018-11-02 | Disposition: A | Discharge status: Home / Self Care | Payer: BLUE CROSS/BLUE SHIELD | Source: Ambulatory Visit | Attending: Obstetrics and Gynecology | Admitting: Obstetrics and Gynecology

## 2018-11-02 ENCOUNTER — Encounter: Payer: Self-pay | Admitting: Obstetrics and Gynecology

## 2018-11-02 ENCOUNTER — Ambulatory Visit (INDEPENDENT_AMBULATORY_CARE_PROVIDER_SITE_OTHER): Payer: BLUE CROSS/BLUE SHIELD | Admitting: Obstetrics and Gynecology

## 2018-11-02 VITALS — BP 126/82 | HR 80 | Temp 98.2°F | Ht 66.0 in | Wt 141.2 lb

## 2018-11-02 DIAGNOSIS — Z01419 Encounter for gynecological examination (general) (routine) without abnormal findings: Secondary | ICD-10-CM | POA: Diagnosis not present

## 2018-11-02 DIAGNOSIS — Z124 Encounter for screening for malignant neoplasm of cervix: Secondary | ICD-10-CM

## 2018-11-02 DIAGNOSIS — Z1211 Encounter for screening for malignant neoplasm of colon: Secondary | ICD-10-CM | POA: Diagnosis not present

## 2018-11-02 DIAGNOSIS — Z3041 Encounter for surveillance of contraceptive pills: Secondary | ICD-10-CM

## 2018-11-02 MED ORDER — LEVONORGESTREL-ETHINYL ESTRAD 0.1-20 MG-MCG PO TABS: 1.0000 | ORAL_TABLET | Freq: Every day | ORAL | 3 refills | Status: DC

## 2018-11-02 NOTE — Patient Instructions (Signed)
EXERCISE AND DIET:  We recommended that you start or continue a regular exercise program for good health. Regular exercise means any activity that makes your heart beat faster and makes you sweat.  We recommend exercising at least 30 minutes per day at least 3 days a week, preferably 4 or 5.  We also recommend a diet low in fat and sugar.  Inactivity, poor dietary choices and obesity can cause diabetes, heart attack, stroke, and kidney damage, among others.    ALCOHOL AND SMOKING:  Women should limit their alcohol intake to no more than 7 drinks/beers/glasses of wine (combined, not each!) per week. Moderation of alcohol intake to this level decreases your risk of breast cancer and liver damage. And of course, no recreational drugs are part of a healthy lifestyle.  And absolutely no smoking or even second hand smoke. Most people know smoking can cause heart and lung diseases, but did you know it also contributes to weakening of your bones? Aging of your skin?  Yellowing of your teeth and nails?  CALCIUM AND VITAMIN D:  Adequate intake of calcium and Vitamin D are recommended.  The recommendations for exact amounts of these supplements seem to change often, but generally speaking 1,000 mg of calcium (between diet and supplement) and 800 units of Vitamin D per day seems prudent. Certain women may benefit from higher intake of Vitamin D.  If you are among these women, your doctor will have told you during your visit.    PAP SMEARS:  Pap smears, to check for cervical cancer or precancers,  have traditionally been done yearly, although recent scientific advances have shown that most women can have pap smears less often.  However, every woman still should have a physical exam from her gynecologist every year. It will include a breast check, inspection of the vulva and vagina to check for abnormal growths or skin changes, a visual exam of the cervix, and then an exam to evaluate the size and shape of the uterus and  ovaries.  And after 47 years of age, a rectal exam is indicated to check for rectal cancers. We will also provide age appropriate advice regarding health maintenance, like when you should have certain vaccines, screening for sexually transmitted diseases, bone density testing, colonoscopy, mammograms, etc.   MAMMOGRAMS:  All women over 40 years old should have a yearly mammogram. Many facilities now offer a "3D" mammogram, which may cost around $50 extra out of pocket. If possible,  we recommend you accept the option to have the 3D mammogram performed.  It both reduces the number of women who will be called back for extra views which then turn out to be normal, and it is better than the routine mammogram at detecting truly abnormal areas.    COLON CANCER SCREENING: Now recommend starting at age 45. At this time colonoscopy is not covered for routine screening until 50. There are take home tests that can be done between 45-49.   COLONOSCOPY:  Colonoscopy to screen for colon cancer is recommended for all women at age 50.  We know, you hate the idea of the prep.  We agree, BUT, having colon cancer and not knowing it is worse!!  Colon cancer so often starts as a polyp that can be seen and removed at colonscopy, which can quite literally save your life!  And if your first colonoscopy is normal and you have no family history of colon cancer, most women don't have to have it again for   10 years.  Once every ten years, you can do something that may end up saving your life, right?  We will be happy to help you get it scheduled when you are ready.  Be sure to check your insurance coverage so you understand how much it will cost.  It may be covered as a preventative service at no cost, but you should check your particular policy.      Breast Self-Awareness Breast self-awareness means being familiar with how your breasts look and feel. It involves checking your breasts regularly and reporting any changes to your  health care provider. Practicing breast self-awareness is important. A change in your breasts can be a sign of a serious medical problem. Being familiar with how your breasts look and feel allows you to find any problems early, when treatment is more likely to be successful. All women should practice breast self-awareness, including women who have had breast implants. How to do a breast self-exam One way to learn what is normal for your breasts and whether your breasts are changing is to do a breast self-exam. To do a breast self-exam: Look for Changes  1. Remove all the clothing above your waist. 2. Stand in front of a mirror in a room with good lighting. 3. Put your hands on your hips. 4. Push your hands firmly downward. 5. Compare your breasts in the mirror. Look for differences between them (asymmetry), such as: ? Differences in shape. ? Differences in size. ? Puckers, dips, and bumps in one breast and not the other. 6. Look at each breast for changes in your skin, such as: ? Redness. ? Scaly areas. 7. Look for changes in your nipples, such as: ? Discharge. ? Bleeding. ? Dimpling. ? Redness. ? A change in position. Feel for Changes Carefully feel your breasts for lumps and changes. It is best to do this while lying on your back on the floor and again while sitting or standing in the shower or tub with soapy water on your skin. Feel each breast in the following way:  Place the arm on the side of the breast you are examining above your head.  Feel your breast with the other hand.  Start in the nipple area and make  inch (2 cm) overlapping circles to feel your breast. Use the pads of your three middle fingers to do this. Apply light pressure, then medium pressure, then firm pressure. The light pressure will allow you to feel the tissue closest to the skin. The medium pressure will allow you to feel the tissue that is a little deeper. The firm pressure will allow you to feel the tissue  close to the ribs.  Continue the overlapping circles, moving downward over the breast until you feel your ribs below your breast.  Move one finger-width toward the center of the body. Continue to use the  inch (2 cm) overlapping circles to feel your breast as you move slowly up toward your collarbone.  Continue the up and down exam using all three pressures until you reach your armpit.  Write Down What You Find  Write down what is normal for each breast and any changes that you find. Keep a written record with breast changes or normal findings for each breast. By writing this information down, you do not need to depend only on memory for size, tenderness, or location. Write down where you are in your menstrual cycle, if you are still menstruating. If you are having trouble noticing differences   in your breasts, do not get discouraged. With time you will become more familiar with the variations in your breasts and more comfortable with the exam. How often should I examine my breasts? Examine your breasts every month. If you are breastfeeding, the best time to examine your breasts is after a feeding or after using a breast pump. If you menstruate, the best time to examine your breasts is 5-7 days after your period is over. During your period, your breasts are lumpier, and it may be more difficult to notice changes. When should I see my health care provider? See your health care provider if you notice:  A change in shape or size of your breasts or nipples.  A change in the skin of your breast or nipples, such as a reddened or scaly area.  Unusual discharge from your nipples.  A lump or thick area that was not there before.  Pain in your breasts.  Anything that concerns you.  

## 2018-11-04 LAB — CYTOLOGY - PAP
Diagnosis: NEGATIVE
HPV: NOT DETECTED

## 2018-11-30 ENCOUNTER — Ambulatory Visit
Admission: RE | Admit: 2018-11-30 | Discharge: 2018-11-30 | Disposition: A | Discharge status: Home / Self Care | Payer: BLUE CROSS/BLUE SHIELD | Source: Ambulatory Visit | Attending: Family Medicine | Admitting: Family Medicine

## 2018-11-30 ENCOUNTER — Other Ambulatory Visit: Payer: Self-pay

## 2018-11-30 DIAGNOSIS — Z1231 Encounter for screening mammogram for malignant neoplasm of breast: Secondary | ICD-10-CM

## 2019-01-06 ENCOUNTER — Encounter: Payer: BC Managed Care – PPO | Admitting: Family Medicine

## 2019-01-10 ENCOUNTER — Telehealth: Payer: Self-pay | Admitting: Obstetrics and Gynecology

## 2019-01-10 NOTE — Telephone Encounter (Signed)
Mychart message sent reminding her to do her cologuard.

## 2019-01-12 ENCOUNTER — Encounter: Payer: BC Managed Care – PPO | Admitting: Family Medicine

## 2019-01-15 NOTE — Progress Notes (Signed)
Subjective:    Connie Robinson is a 48 y.o. female and is here for a comprehensive physical exam.  Current Outpatient Medications:  .  calcium citrate-vitamin D (CITRACAL+D) 315-200 MG-UNIT tablet, Take 1 tablet by mouth 2 (two) times daily., Disp: , Rfl:  .  levonorgestrel-ethinyl estradiol (LARISSIA) 0.1-20 MG-MCG tablet, Take 1 tablet by mouth daily., Disp: 84 tablet, Rfl: 3 .  Multiple Vitamins-Minerals (MULTIVITAMIN & MINERAL PO), Take by mouth., Disp: , Rfl:   Health Maintenance Due  Topic Date Due  . INFLUENZA VACCINE  01/14/2019   PMHx, SurgHx, SocialHx, Medications, and Allergies were reviewed in the Visit Navigator and updated as appropriate.   Past Medical History:  Diagnosis Date  . Chicken pox   . History of BCG vaccination 01/15/1974, 12/12/1982  . Kidney stones   . Mumps      Past Surgical History:  Procedure Laterality Date  . COLPOSCOPY  2000     Family History  Problem Relation Age of Onset  . Hypertension Mother   . Hyperlipidemia Paternal Grandmother   . Hyperlipidemia Paternal Grandfather    Social History   Tobacco Use  . Smoking status: Never Smoker  . Smokeless tobacco: Never Used  Substance Use Topics  . Alcohol use: Yes    Comment: rarely  . Drug use: No   Review of Systems:   Pertinent items are noted in the HPI. Otherwise, ROS is negative.  Objective:   BP 112/80 (BP Location: Right Arm, Patient Position: Sitting, Cuff Size: Normal)   Pulse 91   Temp 98.3 F (36.8 C) (Temporal)   Ht 5\' 6"  (1.676 m)   Wt 138 lb (62.6 kg)   SpO2 96%   BMI 22.27 kg/m    General appearance: alert, cooperative and appears stated age. Head: normocephalic, without obvious abnormality, atraumatic. Neck: no adenopathy, supple, symmetrical, trachea midline; thyroid not enlarged, symmetric, no tenderness/mass/nodules. Lungs: clear to auscultation bilaterally. Heart: regular rate and rhythm Abdomen: soft, non-tender; no masses,  no  organomegaly. Extremities: extremities normal, atraumatic, no cyanosis or edema. Skin: skin color, texture, turgor normal, no rashes or lesions. Lymph: cervical, supraclavicular, and axillary nodes normal; no abnormal inguinal nodes palpated. Neurologic: grossly normal.  Assessment/Plan:   Connie Robinson was seen today for annual exam.  Diagnoses and all orders for this visit:  Routine physical examination  Screening for lipid disorders -     Lipid panel  Vitamin D deficiency -     VITAMIN D 25 Hydroxy (Vit-D Deficiency, Fractures)  Fatigue, unspecified type -     CBC with Differential/Platelet -     Comprehensive metabolic panel    Patient Counseling:   [x]     Nutrition: Stressed importance of moderation in sodium/caffeine intake, saturated fat and cholesterol, caloric balance, sufficient intake of fresh fruits, vegetables, fiber, calcium, iron, and 1 mg of folate supplement per day (for females capable of pregnancy).   [x]      Stressed the importance of regular exercise.    [x]     Substance Abuse: Discussed cessation/primary prevention of tobacco, alcohol, or other drug use; driving or other dangerous activities under the influence; availability of treatment for abuse.    [x]      Injury prevention: Discussed safety belts, safety helmets, smoke detector, smoking near bedding or upholstery.    [x]      Sexuality: Discussed sexually transmitted diseases, partner selection, use of condoms, avoidance of unintended pregnancy  and contraceptive alternatives.    [x]     Dental health: Discussed importance  of regular tooth brushing, flossing, and dental visits.   [x]      Health maintenance and immunizations reviewed. Please refer to Health maintenance section.   Connie Deutscher, DO [place]

## 2019-01-16 ENCOUNTER — Encounter: Payer: Self-pay | Admitting: Family Medicine

## 2019-01-16 ENCOUNTER — Ambulatory Visit (INDEPENDENT_AMBULATORY_CARE_PROVIDER_SITE_OTHER): Payer: BC Managed Care – PPO | Admitting: Family Medicine

## 2019-01-16 ENCOUNTER — Other Ambulatory Visit: Payer: Self-pay

## 2019-01-16 VITALS — BP 112/80 | HR 91 | Temp 98.3°F | Ht 66.0 in | Wt 138.0 lb

## 2019-01-16 DIAGNOSIS — R5383 Other fatigue: Secondary | ICD-10-CM | POA: Diagnosis not present

## 2019-01-16 DIAGNOSIS — E559 Vitamin D deficiency, unspecified: Secondary | ICD-10-CM

## 2019-01-16 DIAGNOSIS — Z13220 Encounter for screening for lipoid disorders: Secondary | ICD-10-CM | POA: Diagnosis not present

## 2019-01-16 DIAGNOSIS — Z0000 Encounter for general adult medical examination without abnormal findings: Secondary | ICD-10-CM

## 2019-01-16 LAB — CBC WITH DIFFERENTIAL/PLATELET
Basophils Absolute: 0.1 10*3/uL (ref 0.0–0.1)
Basophils Relative: 0.8 % (ref 0.0–3.0)
Eosinophils Absolute: 0.1 10*3/uL (ref 0.0–0.7)
Eosinophils Relative: 0.8 % (ref 0.0–5.0)
HCT: 44.1 % (ref 36.0–46.0)
Hemoglobin: 14.7 g/dL (ref 12.0–15.0)
Lymphocytes Relative: 28.8 % (ref 12.0–46.0)
Lymphs Abs: 2.4 10*3/uL (ref 0.7–4.0)
MCHC: 33.3 g/dL (ref 30.0–36.0)
MCV: 93.1 fl (ref 78.0–100.0)
Monocytes Absolute: 0.5 10*3/uL (ref 0.1–1.0)
Monocytes Relative: 5.8 % (ref 3.0–12.0)
Neutro Abs: 5.4 10*3/uL (ref 1.4–7.7)
Neutrophils Relative %: 63.8 % (ref 43.0–77.0)
Platelets: 312 10*3/uL (ref 150.0–400.0)
RBC: 4.73 Mil/uL (ref 3.87–5.11)
RDW: 12.4 % (ref 11.5–15.5)
WBC: 8.5 10*3/uL (ref 4.0–10.5)

## 2019-01-16 LAB — COMPREHENSIVE METABOLIC PANEL
ALT: 14 U/L (ref 0–35)
AST: 11 U/L (ref 0–37)
Albumin: 4.6 g/dL (ref 3.5–5.2)
Alkaline Phosphatase: 38 U/L — ABNORMAL LOW (ref 39–117)
BUN: 13 mg/dL (ref 6–23)
CO2: 26 mEq/L (ref 19–32)
Calcium: 9.9 mg/dL (ref 8.4–10.5)
Chloride: 101 mEq/L (ref 96–112)
Creatinine, Ser: 0.89 mg/dL (ref 0.40–1.20)
GFR: 67.98 mL/min (ref >60.00)
Glucose, Bld: 81 mg/dL (ref 70–99)
Potassium: 4.4 mEq/L (ref 3.5–5.1)
Sodium: 136 mEq/L (ref 135–145)
Total Bilirubin: 0.3 mg/dL (ref 0.2–1.2)
Total Protein: 7 g/dL (ref 6.0–8.3)

## 2019-01-16 LAB — LIPID PANEL
Cholesterol: 171 mg/dL (ref 0–200)
HDL: 46.5 mg/dL (ref >39.00)
LDL Cholesterol: 87 mg/dL (ref 0–99)
NonHDL: 124.86
Total CHOL/HDL Ratio: 4
Triglycerides: 187 mg/dL — ABNORMAL HIGH (ref 0.0–149.0)
VLDL: 37.4 mg/dL (ref 0.0–40.0)

## 2019-01-16 LAB — VITAMIN D 25 HYDROXY (VIT D DEFICIENCY, FRACTURES): VITD: 44.66 ng/mL (ref 30.00–100.00)

## 2019-01-22 ENCOUNTER — Encounter: Payer: Self-pay | Admitting: Family Medicine

## 2019-08-08 ENCOUNTER — Other Ambulatory Visit: Payer: Self-pay | Admitting: Obstetrics and Gynecology

## 2019-08-23 ENCOUNTER — Other Ambulatory Visit: Payer: Self-pay | Admitting: Obstetrics and Gynecology

## 2019-08-23 DIAGNOSIS — Z3041 Encounter for surveillance of contraceptive pills: Secondary | ICD-10-CM

## 2019-08-24 NOTE — Telephone Encounter (Signed)
Med refill request: Clovis Fredrickson Last AEX: 11/02/2018 Next AEX: 11/06/2019 Last MMG (if hormonal med) 11/2018, Birads 1, Negative Refill authorized: # [id number], 3 RF, pended if approved.

## 2019-10-08 ENCOUNTER — Other Ambulatory Visit: Payer: Self-pay | Admitting: Obstetrics and Gynecology

## 2019-10-08 DIAGNOSIS — Z3041 Encounter for surveillance of contraceptive pills: Secondary | ICD-10-CM

## 2019-10-17 ENCOUNTER — Other Ambulatory Visit: Payer: Self-pay | Admitting: Family Medicine

## 2019-10-17 DIAGNOSIS — Z1231 Encounter for screening mammogram for malignant neoplasm of breast: Secondary | ICD-10-CM

## 2019-10-24 ENCOUNTER — Other Ambulatory Visit: Payer: Self-pay | Admitting: Obstetrics and Gynecology

## 2019-10-24 DIAGNOSIS — Z3041 Encounter for surveillance of contraceptive pills: Secondary | ICD-10-CM

## 2019-10-25 NOTE — Telephone Encounter (Signed)
Medication refill request: Connie Robinson Last AEX:  11/02/18 JJ Next AEX: 11/09/19 Last MMG (if hormonal medication request): 11/30/18 BIRADS 1 negative/density d Refill authorized: Please advise; order pended for a month supply with 0 refills if authorized

## 2019-11-06 ENCOUNTER — Ambulatory Visit: Payer: BLUE CROSS/BLUE SHIELD | Admitting: Obstetrics and Gynecology

## 2019-11-09 ENCOUNTER — Ambulatory Visit: Payer: BLUE CROSS/BLUE SHIELD | Admitting: Obstetrics and Gynecology

## 2019-11-29 ENCOUNTER — Other Ambulatory Visit: Payer: Self-pay

## 2019-11-29 NOTE — Progress Notes (Signed)
49 y.o. G2P2 Married White or Caucasian Not Hispanic or Latino female here for annual exam.  On OCP's, lighter with time. No dyspareunia.   Period Cycle (Days): 28 Period Duration (Days): 3-4 Period Pattern: Regular Menstrual Flow: Light Menstrual Control: Panty liner, Tampon Menstrual Control Change Freq (Hours): 8 Dysmenorrhea: None  Patient states that some months on the second week on her pill pack she will have one day of brown spotting. Occurring ~1/2 of the time, she does occasionally miss pills.    Patient's last menstrual period was 11/15/2019.          Sexually active: Yes.    The current method of family planning is OCP (estrogen/progesterone).    Exercising: No.  The patient does not participate in regular exercise at present. Smoker:  no  Health Maintenance: Pap: 09-25-15 WNL NEG HR HPV History of abnormal Pap:  Yes in her 20's She had a colposcopy.  MMG:  11/30/18 density D bi-rads 1 neg, scheduled   BMD:  Never Colonoscopy: Never TDaP:  12/24/09 Gardasil: NA   reports that she has never smoked. She has never used smokeless tobacco. She reports current alcohol use. She reports that she does not use drugs. 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 Kids are 15(boy)and 17(girl).Kids go to [employer] finished 8and 10grade.  Past Medical History:  Diagnosis Date  . Chicken pox   . History of BCG vaccination 01/15/1974, 12/12/1982  . Kidney stones   . Mumps     Past Surgical History:  Procedure Laterality Date  . COLPOSCOPY  2000    Current Outpatient Medications  Medication Sig Dispense Refill  . calcium citrate-vitamin D (CITRACAL+D) 315-200 MG-UNIT tablet Take 1 tablet by mouth 2 (two) times daily.    Marland Kitchen LARISSIA 0.1-20 MG-MCG tablet TAKE 1 TABLET BY MOUTH  DAILY 1 Package 0  . Multiple Vitamins-Minerals (MULTIVITAMIN & MINERAL PO) Take by mouth.     No current  facility-administered medications for this visit.    Family History  Problem Relation Age of Onset  . Hypertension Mother   . Hyperlipidemia Paternal Grandmother   . Hyperlipidemia Paternal Grandfather     Review of Systems  All other systems reviewed and are negative.   Exam:   BP 126/64   Pulse 93   Temp 98.2 F (36.8 C)   Ht 5' 5.5" (1.664 m)   Wt 136 lb (61.7 kg)   LMP 11/15/2019   SpO2 98%   BMI 22.29 kg/m   Weight change: @WEIGHTCHANGE @ Height:   Height: 5' 5.5" (166.4 cm)  Ht Readings from Last 3 Encounters:  11/30/19 5' 5.5" (1.664 m)  01/16/19 5\' 6"  (1.676 m)  11/02/18 5\' 6"  (1.676 m)    General appearance: alert, cooperative and appears stated age Head: Normocephalic, without obvious abnormality, atraumatic Neck: no adenopathy, supple, symmetrical, trachea midline and thyroid normal to inspection and palpation Lungs: clear to auscultation bilaterally Cardiovascular: regular rate and rhythm Breasts: normal appearance, no masses or tenderness Abdomen: soft, non-tender; non distended,  no masses,  no organomegaly Extremities: extremities normal, atraumatic, no cyanosis or edema Skin: Skin color, texture, turgor normal. No rashes or lesions Lymph nodes: Cervical, supraclavicular, and axillary nodes normal. No abnormal inguinal nodes palpated Neurologic: Grossly normal   Pelvic: External genitalia:  no lesions              Urethra:  normal appearing urethra with  no masses, tenderness or lesions              Bartholins and Skenes: normal                 Vagina: normal appearing vagina with normal color and discharge, no lesions              Cervix: no lesions and friable cervical               Bimanual Exam:  Uterus:  normal size, contour, position, consistency, mobility, non-tender              Adnexa: no mass, fullness, tenderness               Rectovaginal: Confirms               Anus:  normal sphincter tone, no lesions  Abbott Pao chaperoned for the  exam.  A:  Well Woman with normal exam  Some BTB on OCP's, occasional missed pills  Friable cervix on exam  P:   Pap with hpv  Discussed breast self exam  Discussed calcium and vit D intake  Mammogram scheduled  Calendar spotting, missed pills, intercourse in relation to spotting.

## 2019-11-30 ENCOUNTER — Ambulatory Visit (INDEPENDENT_AMBULATORY_CARE_PROVIDER_SITE_OTHER): Payer: 59 | Admitting: Obstetrics and Gynecology

## 2019-11-30 ENCOUNTER — Other Ambulatory Visit (HOSPITAL_COMMUNITY)
Admission: RE | Admit: 2019-11-30 | Discharge: 2019-11-30 | Disposition: A | Discharge status: Home / Self Care | Payer: 59 | Source: Ambulatory Visit | Attending: Obstetrics and Gynecology | Admitting: Obstetrics and Gynecology

## 2019-11-30 ENCOUNTER — Encounter: Payer: Self-pay | Admitting: Obstetrics and Gynecology

## 2019-11-30 VITALS — BP 126/64 | HR 93 | Temp 98.2°F | Ht 65.5 in | Wt 136.0 lb

## 2019-11-30 DIAGNOSIS — N921 Excessive and frequent menstruation with irregular cycle: Secondary | ICD-10-CM

## 2019-11-30 DIAGNOSIS — Z3041 Encounter for surveillance of contraceptive pills: Secondary | ICD-10-CM | POA: Diagnosis not present

## 2019-11-30 DIAGNOSIS — Z124 Encounter for screening for malignant neoplasm of cervix: Secondary | ICD-10-CM

## 2019-11-30 DIAGNOSIS — Z01419 Encounter for gynecological examination (general) (routine) without abnormal findings: Secondary | ICD-10-CM | POA: Diagnosis not present

## 2019-11-30 DIAGNOSIS — N888 Other specified noninflammatory disorders of cervix uteri: Secondary | ICD-10-CM | POA: Diagnosis not present

## 2019-11-30 MED ORDER — LEVONORGESTREL-ETHINYL ESTRAD 0.1-20 MG-MCG PO TABS: 1.0000 | ORAL_TABLET | Freq: Every day | ORAL | 3 refills | Status: DC

## 2019-11-30 NOTE — Patient Instructions (Signed)
EXERCISE AND DIET:  We recommended that you start or continue a regular exercise program for good health. Regular exercise means any activity that makes your heart beat faster and makes you sweat.  We recommend exercising at least 30 minutes per day at least 3 days a week, preferably 4 or 5.  We also recommend a diet low in fat and sugar.  Inactivity, poor dietary choices and obesity can cause diabetes, heart attack, stroke, and kidney damage, among others.    ALCOHOL AND SMOKING:  Women should limit their alcohol intake to no more than 7 drinks/beers/glasses of wine (combined, not each!) per week. Moderation of alcohol intake to this level decreases your risk of breast cancer and liver damage. And of course, no recreational drugs are part of a healthy lifestyle.  And absolutely no smoking or even second hand smoke. Most people know smoking can cause heart and lung diseases, but did you know it also contributes to weakening of your bones? Aging of your skin?  Yellowing of your teeth and nails?  CALCIUM AND VITAMIN D:  Adequate intake of calcium and Vitamin D are recommended.  The recommendations for exact amounts of these supplements seem to change often, but generally speaking 1,000 mg of calcium (between diet and supplement) and 800 units of Vitamin D per day seems prudent. Certain women may benefit from higher intake of Vitamin D.  If you are among these women, your doctor will have told you during your visit.    PAP SMEARS:  Pap smears, to check for cervical cancer or precancers,  have traditionally been done yearly, although recent scientific advances have shown that most women can have pap smears less often.  However, every woman still should have a physical exam from her gynecologist every year. It will include a breast check, inspection of the vulva and vagina to check for abnormal growths or skin changes, a visual exam of the cervix, and then an exam to evaluate the size and shape of the uterus and  ovaries.  And after 48 years of age, a rectal exam is indicated to check for rectal cancers. We will also provide age appropriate advice regarding health maintenance, like when you should have certain vaccines, screening for sexually transmitted diseases, bone density testing, colonoscopy, mammograms, etc.   MAMMOGRAMS:  All women over 40 years old should have a yearly mammogram. Many facilities now offer a "3D" mammogram, which may cost around $50 extra out of pocket. If possible,  we recommend you accept the option to have the 3D mammogram performed.  It both reduces the number of women who will be called back for extra views which then turn out to be normal, and it is better than the routine mammogram at detecting truly abnormal areas.    COLON CANCER SCREENING: Now recommend starting at age 45. At this time colonoscopy is not covered for routine screening until 50. There are take home tests that can be done between 45-49.   COLONOSCOPY:  Colonoscopy to screen for colon cancer is recommended for all women at age 50.  We know, you hate the idea of the prep.  We agree, BUT, having colon cancer and not knowing it is worse!!  Colon cancer so often starts as a polyp that can be seen and removed at colonscopy, which can quite literally save your life!  And if your first colonoscopy is normal and you have no family history of colon cancer, most women don't have to have it again for   10 years.  Once every ten years, you can do something that may end up saving your life, right?  We will be happy to help you get it scheduled when you are ready.  Be sure to check your insurance coverage so you understand how much it will cost.  It may be covered as a preventative service at no cost, but you should check your particular policy.      Breast Self-Awareness Breast self-awareness means being familiar with how your breasts look and feel. It involves checking your breasts regularly and reporting any changes to your  health care provider. Practicing breast self-awareness is important. A change in your breasts can be a sign of a serious medical problem. Being familiar with how your breasts look and feel allows you to find any problems early, when treatment is more likely to be successful. All women should practice breast self-awareness, including women who have had breast implants. How to do a breast self-exam One way to learn what is normal for your breasts and whether your breasts are changing is to do a breast self-exam. To do a breast self-exam: Look for Changes  1. Remove all the clothing above your waist. 2. Stand in front of a mirror in a room with good lighting. 3. Put your hands on your hips. 4. Push your hands firmly downward. 5. Compare your breasts in the mirror. Look for differences between them (asymmetry), such as: ? Differences in shape. ? Differences in size. ? Puckers, dips, and bumps in one breast and not the other. 6. Look at each breast for changes in your skin, such as: ? Redness. ? Scaly areas. 7. Look for changes in your nipples, such as: ? Discharge. ? Bleeding. ? Dimpling. ? Redness. ? A change in position. Feel for Changes Carefully feel your breasts for lumps and changes. It is best to do this while lying on your back on the floor and again while sitting or standing in the shower or tub with soapy water on your skin. Feel each breast in the following way:  Place the arm on the side of the breast you are examining above your head.  Feel your breast with the other hand.  Start in the nipple area and make  inch (2 cm) overlapping circles to feel your breast. Use the pads of your three middle fingers to do this. Apply light pressure, then medium pressure, then firm pressure. The light pressure will allow you to feel the tissue closest to the skin. The medium pressure will allow you to feel the tissue that is a little deeper. The firm pressure will allow you to feel the tissue  close to the ribs.  Continue the overlapping circles, moving downward over the breast until you feel your ribs below your breast.  Move one finger-width toward the center of the body. Continue to use the  inch (2 cm) overlapping circles to feel your breast as you move slowly up toward your collarbone.  Continue the up and down exam using all three pressures until you reach your armpit.  Write Down What You Find  Write down what is normal for each breast and any changes that you find. Keep a written record with breast changes or normal findings for each breast. By writing this information down, you do not need to depend only on memory for size, tenderness, or location. Write down where you are in your menstrual cycle, if you are still menstruating. If you are having trouble noticing differences   in your breasts, do not get discouraged. With time you will become more familiar with the variations in your breasts and more comfortable with the exam. How often should I examine my breasts? Examine your breasts every month. If you are breastfeeding, the best time to examine your breasts is after a feeding or after using a breast pump. If you menstruate, the best time to examine your breasts is 5-7 days after your period is over. During your period, your breasts are lumpier, and it may be more difficult to notice changes. When should I see my health care provider? See your health care provider if you notice:  A change in shape or size of your breasts or nipples.  A change in the skin of your breast or nipples, such as a reddened or scaly area.  Unusual discharge from your nipples.  A lump or thick area that was not there before.  Pain in your breasts.  Anything that concerns you.  

## 2019-12-04 LAB — CYTOLOGY - PAP
Comment: NEGATIVE
Diagnosis: NEGATIVE
High risk HPV: NEGATIVE

## 2019-12-06 ENCOUNTER — Ambulatory Visit
Admission: RE | Admit: 2019-12-06 | Discharge: 2019-12-06 | Disposition: A | Discharge status: Home / Self Care | Payer: 59 | Source: Ambulatory Visit | Attending: Family Medicine | Admitting: Family Medicine

## 2019-12-06 ENCOUNTER — Other Ambulatory Visit: Payer: Self-pay

## 2019-12-06 DIAGNOSIS — Z1231 Encounter for screening mammogram for malignant neoplasm of breast: Secondary | ICD-10-CM

## 2019-12-11 ENCOUNTER — Other Ambulatory Visit: Payer: Self-pay | Admitting: Family Medicine

## 2019-12-11 DIAGNOSIS — R928 Other abnormal and inconclusive findings on diagnostic imaging of breast: Secondary | ICD-10-CM

## 2019-12-28 ENCOUNTER — Ambulatory Visit
Admission: RE | Admit: 2019-12-28 | Discharge: 2019-12-28 | Disposition: A | Discharge status: Home / Self Care | Payer: 59 | Source: Ambulatory Visit | Attending: Family Medicine | Admitting: Family Medicine

## 2019-12-28 ENCOUNTER — Ambulatory Visit: Payer: 59

## 2019-12-28 ENCOUNTER — Other Ambulatory Visit: Payer: Self-pay

## 2019-12-28 DIAGNOSIS — R928 Other abnormal and inconclusive findings on diagnostic imaging of breast: Secondary | ICD-10-CM

## 2020-01-10 ENCOUNTER — Telehealth: Payer: Self-pay | Admitting: *Deleted

## 2020-01-10 NOTE — Telephone Encounter (Signed)
Patient is in MMG hold for recall from screening MMG on 12/08/19 for left breast asymmetry.   Left breast US completed on 12/28/19 at [facility]   IMPRESSION: Resolution of the left breast asymmetry consistent with overlapping fibroglandular tissue.  RECOMMENDATION: Screening mammogram in one year.(Code:[id number]-B-01Y)   Removed from MMG hold.   Routing to Dr. Oscar La for final review.

## 2020-01-10 NOTE — Telephone Encounter (Signed)
You can close the encounter.

## 2020-01-11 NOTE — Telephone Encounter (Signed)
Encounter closed

## 2020-01-23 ENCOUNTER — Encounter: Payer: BC Managed Care – PPO | Admitting: Family Medicine

## 2020-01-24 ENCOUNTER — Ambulatory Visit (INDEPENDENT_AMBULATORY_CARE_PROVIDER_SITE_OTHER): Payer: 59 | Admitting: Family Medicine

## 2020-01-24 ENCOUNTER — Other Ambulatory Visit: Payer: Self-pay

## 2020-01-24 ENCOUNTER — Encounter: Payer: Self-pay | Admitting: Family Medicine

## 2020-01-24 VITALS — BP 120/82 | HR 76 | Temp 98.2°F | Resp 14 | Ht 66.0 in | Wt 136.8 lb

## 2020-01-24 DIAGNOSIS — Z0000 Encounter for general adult medical examination without abnormal findings: Secondary | ICD-10-CM | POA: Diagnosis not present

## 2020-01-24 DIAGNOSIS — Z3041 Encounter for surveillance of contraceptive pills: Secondary | ICD-10-CM | POA: Diagnosis not present

## 2020-01-24 DIAGNOSIS — N200 Calculus of kidney: Secondary | ICD-10-CM | POA: Diagnosis not present

## 2020-01-24 NOTE — Patient Instructions (Signed)
Please return in 12 months for your annual complete physical; please come fasting.  I will release your lab results to you on your MyChart account with further instructions. Please reply with any questions.   It was a pleasure meeting you today! Thank you for choosing us to meet your healthcare needs! I truly look forward to working with you. If you have any questions or concerns, please send me a message via Mychart or call the office at [phone].  Please do these things to maintain good health!   Exercise at least 30-45 minutes a day,  4-5 days a week.   Eat a low-fat diet with lots of fruits and vegetables, up to 7-9 servings per day.  Drink plenty of water daily. Try to drink 8 8oz glasses per day.  Seatbelts can save your life. Always wear your seatbelt.  Place Smoke Detectors on every level of your home and check batteries every year.  Schedule an appointment with an eye doctor for an eye exam every 1-2 years  Safe sex - use condoms to protect yourself from STDs if you could be exposed to these types of infections. Use birth control if you do not want to become pregnant and are sexually active.  Avoid heavy alcohol use. If you drink, keep it to less than 2 drinks/day and not every day.  Health Care Power of Attorney.  Choose someone you trust that could speak for you if you became unable to speak for yourself.  Depression is common in our stressful world.If you're feeling down or losing interest in things you normally enjoy, please come in for a visit.  If anyone is threatening or hurting you, please get help. Physical or Emotional Violence is never OK.   

## 2020-01-24 NOTE — Progress Notes (Signed)
Subjective  Chief Complaint  Patient presents with  . Transitions Of Care  . Annual Exam    fasting     HPI: Connie Robinson is a 49 y.o. female who presents to [facility] today for a Female Wellness Visit.   Wellness Visit: annual visit with health maintenance review and exam without Pap   Health maintenance: Reviewed recent GYN exam.  Normal exam.  Pap smear done.  Mammogram done and normal.  On oral contraceptives with light menses  No chronic health conditions: Lives healthy lifestyle, eats well.  Exercises regularly but less during Covid.  Recently started back to work after 10-year hiatus while raising her children.  Enjoying it.  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  No new concerns  Assessment  1. Annual physical exam   2. Oral contraceptive use   3. Nephrolithiasis      Plan  Female Wellness Visit:  Age appropriate Health Maintenance and Prevention measures were discussed with patient. Included topics are cancer screening recommendations, ways to keep healthy (see AVS) including dietary and exercise recommendations, regular eye and dental care, use of seat belts, and avoidance of moderate alcohol use and tobacco use.  Screens are up-to-date  BMI: discussed patient's BMI and encouraged positive lifestyle modifications to help get to or maintain a target BMI.  HM needs and immunizations were addressed and ordered. See below for orders. See HM and immunization section for updates.  Routine labs and screening tests ordered including cmp, cbc and lipids where appropriate.  Discussed recommendations regarding Vit D and calcium supplementation (see AVS)  Follow up: 12 months for complete physical  Orders Placed This Encounter  Procedures  . CBC with Differential/Platelet  . Comprehensive metabolic panel  . Lipid panel  . Hepatitis C antibody   No orders of the defined types were placed in this encounter.    Lifestyle: Body mass index is  22.08 kg/m. Wt Readings from Last 3 Encounters:  01/24/20 136 lb 12.8 oz (62.1 kg)  11/30/19 136 lb (61.7 kg)  01/16/19 138 lb (62.6 kg)    Patient Active Problem List   Diagnosis Date Noted  . Nephrolithiasis    Health Maintenance  Topic Date Due  . Hepatitis C Screening  Never done  . INFLUENZA VACCINE  01/14/2020  . TETANUS/TDAP  02/03/2020  . PAP SMEAR-Modifier  11/29/2024  . COVID-19 Vaccine  Completed  . HIV Screening  Completed   Immunization History  Administered Date(s) Administered  . DTaP 08/20/1972, 07/03/1973, 01/15/1978, 12/12/1982, 12/24/2009  . Hepatitis A 03/15/1996, 03/15/1998  . Hepatitis B 03/15/1996, 05/15/1996, 11/13/1996  . IPV 01/20/1978, 12/12/1982, 12/28/1988, 12/05/1993  . Janssen (J&J) SARS-COV-2 Vaccination 09/12/2019  . MMR 04/18/1973  . Td 03/15/1996  . Varicella 09/02/1973   We updated and reviewed the patient's past history in detail and it is documented below. Allergies: Patient has No Known Allergies. Past Medical History Patient  has a past medical history of Chicken pox, History of BCG vaccination (01/15/1974, 12/12/1982), Mumps, and Nephrolithiasis. Past Surgical History Patient  has a past surgical history that includes Colposcopy (2000). Family History: Patient family history includes Anxiety disorder in her mother; Diabetes in her paternal grandmother; Healthy in her daughter, father, and son; Hyperlipidemia in her paternal grandfather and paternal grandmother; Hypertension in her mother. Social History:  Patient  reports that she has never smoked. She has never used smokeless tobacco. She reports current alcohol use. She reports that she does not use drugs.  Review of Systems: Constitutional: negative for fever or malaise Ophthalmic: negative for photophobia, double vision or loss of vision Cardiovascular: negative for chest pain, dyspnea on exertion, or new LE swelling Respiratory: negative for SOB or persistent  cough Gastrointestinal: negative for abdominal pain, change in bowel habits or melena Genitourinary: negative for dysuria or gross hematuria, no abnormal uterine bleeding or disharge Musculoskeletal: negative for new gait disturbance or muscular weakness Integumentary: negative for new or persistent rashes, no breast lumps Neurological: negative for TIA or stroke symptoms Psychiatric: negative for SI or delusions Allergic/Immunologic: negative for hives  Patient Care Team    Relationship Specialty Notifications Start End  Leamon Arnt, MD PCP - General Family Medicine  12/06/19   Salvadore Dom, MD Consulting Physician Obstetrics and Gynecology  11/30/19     Objective  Vitals: BP 120/82   Pulse 76   Temp 98.2 F (36.8 C) (Temporal)   Resp 14   Ht 5' 6" (1.676 m)   Wt 136 lb 12.8 oz (62.1 kg)   SpO2 98%   BMI 22.08 kg/m  General:  Well developed, well nourished, no acute distress  Psych:  Alert and orientedx3,normal mood and affect HEENT:  Normocephalic, atraumatic, non-icteric sclera, supple neck without adenopathy, mass or thyromegaly Cardiovascular:  Normal S1, S2, RRR without gallop, rub or murmur Respiratory:  Good breath sounds bilaterally, CTAB with normal respiratory effort Gastrointestinal: normal bowel sounds, soft, non-tender, no noted masses. No HSM MSK: no deformities, contusions. Joints are without erythema or swelling.  Skin:  Warm, no rashes or suspicious lesions noted Neurologic:    Mental status is normal. CN 2-11 are normal. Gross motor and sensory exams are normal. Normal gait. No tremor    Commons side effects, risks, benefits, and alternatives for medications and treatment plan prescribed today were discussed, and the patient expressed understanding of the given instructions. Patient is instructed to call or message via MyChart if he/she has any questions or concerns regarding our treatment plan. No barriers to understanding were identified. We  discussed Red Flag symptoms and signs in detail. Patient expressed understanding regarding what to do in case of urgent or emergency type symptoms.   Medication list was reconciled, printed and provided to the patient in AVS. Patient instructions and summary information was reviewed with the patient as documented in the AVS. This note was prepared with assistance of Dragon voice recognition software. Occasional wrong-word or sound-a-like substitutions may have occurred due to the inherent limitations of voice recognition software  This visit occurred during the SARS-CoV-2 public health emergency.  Safety protocols were in place, including screening questions prior to the visit, additional usage of staff PPE, and extensive cleaning of exam room while observing appropriate contact time as indicated for disinfecting solutions.

## 2020-01-25 LAB — COMPREHENSIVE METABOLIC PANEL
AG Ratio: 1.9 (calc) (ref 1.0–2.5)
ALT: 17 U/L (ref 6–29)
AST: 13 U/L (ref 10–35)
Albumin: 4.5 g/dL (ref 3.6–5.1)
Alkaline phosphatase (APISO): 35 U/L (ref 31–125)
BUN: 10 mg/dL (ref 7–25)
CO2: 27 mmol/L (ref 20–32)
Calcium: 9.7 mg/dL (ref 8.6–10.2)
Chloride: 103 mmol/L (ref 98–110)
Creat: 0.76 mg/dL (ref 0.50–1.10)
Globulin: 2.4 g/dL (calc) (ref 1.9–3.7)
Glucose, Bld: 89 mg/dL (ref 65–99)
Potassium: 4.5 mmol/L (ref 3.5–5.3)
Sodium: 137 mmol/L (ref 135–146)
Total Bilirubin: 0.4 mg/dL (ref 0.2–1.2)
Total Protein: 6.9 g/dL (ref 6.1–8.1)

## 2020-01-25 LAB — CBC WITH DIFFERENTIAL/PLATELET
Absolute Monocytes: 354 cells/uL (ref 200–950)
Basophils Absolute: 41 cells/uL (ref 0–200)
Basophils Relative: 0.6 %
Eosinophils Absolute: 27 cells/uL (ref 15–500)
Eosinophils Relative: 0.4 %
HCT: 44.2 % (ref 35.0–45.0)
Hemoglobin: 15 g/dL (ref 11.7–15.5)
Lymphs Abs: 2122 cells/uL (ref 850–3900)
MCH: 31.8 pg (ref 27.0–33.0)
MCHC: 33.9 g/dL (ref 32.0–36.0)
MCV: 93.8 fL (ref 80.0–100.0)
MPV: 9.6 fL (ref 7.5–12.5)
Monocytes Relative: 5.2 %
Neutro Abs: 4257 cells/uL (ref 1500–7800)
Neutrophils Relative %: 62.6 %
Platelets: 329 10*3/uL (ref 140–400)
RBC: 4.71 10*6/uL (ref 3.80–5.10)
RDW: 11.7 % (ref 11.0–15.0)
Total Lymphocyte: 31.2 %
WBC: 6.8 10*3/uL (ref 3.8–10.8)

## 2020-01-25 LAB — LIPID PANEL
Cholesterol: 186 mg/dL (ref <200)
HDL: 50 mg/dL (ref >=50)
LDL Cholesterol (Calc): 115 mg/dL (calc) — ABNORMAL HIGH
Non-HDL Cholesterol (Calc): 136 mg/dL (calc) — ABNORMAL HIGH (ref <130)
Total CHOL/HDL Ratio: 3.7 (calc) (ref <5.0)
Triglycerides: 103 mg/dL (ref <150)

## 2020-01-25 LAB — HEPATITIS C ANTIBODY
Hepatitis C Ab: NONREACTIVE
SIGNAL TO CUT-OFF: 0.01 (ref <1.00)

## 2020-10-31 ENCOUNTER — Other Ambulatory Visit: Payer: Self-pay | Admitting: Obstetrics and Gynecology

## 2020-10-31 DIAGNOSIS — Z1231 Encounter for screening mammogram for malignant neoplasm of breast: Secondary | ICD-10-CM

## 2020-12-04 ENCOUNTER — Ambulatory Visit: Payer: 59 | Admitting: Obstetrics and Gynecology

## 2020-12-08 ENCOUNTER — Other Ambulatory Visit: Payer: Self-pay | Admitting: Obstetrics and Gynecology

## 2020-12-08 DIAGNOSIS — Z3041 Encounter for surveillance of contraceptive pills: Secondary | ICD-10-CM

## 2020-12-09 NOTE — Telephone Encounter (Signed)
Annual exam scheduled on 02/27/21 Mammogram scheduled on 01/10/21

## 2021-01-10 ENCOUNTER — Other Ambulatory Visit: Payer: Self-pay

## 2021-01-10 ENCOUNTER — Ambulatory Visit
Admission: RE | Admit: 2021-01-10 | Discharge: 2021-01-10 | Disposition: A | Discharge status: Home / Self Care | Payer: 59 | Source: Ambulatory Visit

## 2021-01-10 DIAGNOSIS — Z1231 Encounter for screening mammogram for malignant neoplasm of breast: Secondary | ICD-10-CM

## 2021-01-28 ENCOUNTER — Encounter: Payer: 59 | Admitting: Family Medicine

## 2021-02-05 ENCOUNTER — Encounter: Payer: 59 | Admitting: Family Medicine

## 2021-02-25 NOTE — Progress Notes (Signed)
50 y.o. G2P2 Married White or Caucasian Not Hispanic or Latino female here for annual exam.  On OCP's, doing well. Cycles are very light, mainly spotting. Sexually active, no pain. No bowel or bladder issues.   Period Cycle (Days): 28 Period Duration (Days): 3 Period Pattern: Regular Menstrual Flow: Light Menstrual Control: Panty liner, Tampon Menstrual Control Change Freq (Hours): 6 Dysmenorrhea: None  Patient's last menstrual period was 02/26/2021.          Sexually active: Yes.    The current method of family planning is OCP (estrogen/progesterone).    Exercising: No.  The patient does not participate in regular exercise at present. Smoker:  no  Health Maintenance: Pap:  11/30/19 WNL Hr HPV Neg, 09-25-15 WNL NEG HR HPV  History of abnormal Pap:  Yes in her 20's She had a colposcopy.  MMG:  01/14/21 Birads 1 neg  BMD:   none  Colonoscopy: never  TDaP:  12/24/09  Gardasil: na   reports that she has never smoked. She has never used smokeless tobacco. She reports current alcohol use. She reports that she does not use drugs. She is originally from [place], she has a Environmental manager in [place]. Daughter is a Printmaker in college in [place]. Son is a Holiday representative at [employer].   Past Medical History:  Diagnosis Date   Chicken pox    History of BCG vaccination 01/15/1974, 12/12/1982   Mumps    Nephrolithiasis     Past Surgical History:  Procedure Laterality Date   COLPOSCOPY  2000    Current Outpatient Medications  Medication Sig Dispense Refill   calcium citrate-vitamin D (CITRACAL+D) 315-200 MG-UNIT tablet Take 1 tablet by mouth 2 (two) times daily.     LARISSIA 0.1-20 MG-MCG tablet TAKE 1 TABLET BY MOUTH  DAILY 84 tablet 0   Multiple Vitamins-Minerals (MULTIVITAMIN & MINERAL PO) Take by mouth.     No current facility-administered medications for this visit.    Family History  Problem Relation Age of Onset   Hypertension Mother    Anxiety disorder Mother    Healthy  Father    Hyperlipidemia Paternal Grandmother    Diabetes Paternal Grandmother    Hyperlipidemia Paternal Grandfather    Healthy Daughter    Healthy Son    Cancer Neg Hx    Stroke Neg Hx     Review of Systems  All other systems reviewed and are negative.  Exam:   BP 110/72   Pulse 65   Ht 5' 5.5" (1.664 m)   Wt 137 lb (62.1 kg)   LMP 02/26/2021   SpO2 99%   BMI 22.45 kg/m   Weight change: @WEIGHTCHANGE @ Height:   Height: 5' 5.5" (166.4 cm)  Ht Readings from Last 3 Encounters:  02/27/21 5' 5.5" (1.664 m)  01/24/20 5\' 6"  (1.676 m)  11/30/19 5' 5.5" (1.664 m)    General appearance: alert, cooperative and appears stated age Head: Normocephalic, without obvious abnormality, atraumatic Neck: no adenopathy, supple, symmetrical, trachea midline and thyroid normal to inspection and palpation Lungs: clear to auscultation bilaterally Cardiovascular: regular rate and rhythm Breasts: normal appearance, no masses or tenderness Abdomen: soft, non-tender; non distended,  no masses,  no organomegaly Extremities: extremities normal, atraumatic, no cyanosis or edema Skin: Skin color, texture, turgor normal. No rashes or lesions Lymph nodes: Cervical, supraclavicular, and axillary nodes normal. No abnormal inguinal nodes palpated Neurologic: Grossly normal   Pelvic: External genitalia:  no lesions  Urethra:  normal appearing urethra with no masses, tenderness or lesions              Bartholins and Skenes: normal                 Vagina: normal appearing vagina with normal color and discharge, no lesions              Cervix:  +ectropion, nabothian cysts, slight whitening over the cysts at 12 o'clock, pap taken, friable with pap.                Bimanual Exam:  Uterus:  normal size, contour, position, consistency, mobility, non-tender and anteverted              Adnexa: no mass, fullness, tenderness               Rectovaginal: Confirms               Anus:  normal sphincter  tone, no lesions  Carolynn Serve chaperoned for the exam.  1. Well woman exam Discussed breast self exam Discussed calcium and vit D intake Mammogram UTD  2. Colon cancer screening - Ambulatory referral to Gastroenterology  3. Encounter for surveillance of contraceptive pills Doing well - levonorgestrel-ethinyl estradiol (LARISSIA) 0.1-20 MG-MCG tablet; Take 1 tablet by mouth daily.  Dispense: 84 tablet; Refill: 3  4. Screening for cervical cancer Slight white appearance of cervix, over nabothian cyst. Friable cervix, will check pap - Cytology - PAP

## 2021-02-27 ENCOUNTER — Encounter: Payer: Self-pay | Admitting: Obstetrics and Gynecology

## 2021-02-27 ENCOUNTER — Ambulatory Visit (INDEPENDENT_AMBULATORY_CARE_PROVIDER_SITE_OTHER): Payer: 59 | Admitting: Obstetrics and Gynecology

## 2021-02-27 ENCOUNTER — Other Ambulatory Visit (HOSPITAL_COMMUNITY)
Admission: RE | Admit: 2021-02-27 | Discharge: 2021-02-27 | Disposition: A | Discharge status: Home / Self Care | Payer: 59 | Source: Ambulatory Visit | Attending: Obstetrics and Gynecology | Admitting: Obstetrics and Gynecology

## 2021-02-27 ENCOUNTER — Other Ambulatory Visit: Payer: Self-pay

## 2021-02-27 VITALS — BP 110/72 | HR 65 | Ht 65.5 in | Wt 137.0 lb

## 2021-02-27 DIAGNOSIS — Z1211 Encounter for screening for malignant neoplasm of colon: Secondary | ICD-10-CM | POA: Diagnosis not present

## 2021-02-27 DIAGNOSIS — Z124 Encounter for screening for malignant neoplasm of cervix: Secondary | ICD-10-CM | POA: Diagnosis not present

## 2021-02-27 DIAGNOSIS — Z01419 Encounter for gynecological examination (general) (routine) without abnormal findings: Secondary | ICD-10-CM

## 2021-02-27 DIAGNOSIS — Z3041 Encounter for surveillance of contraceptive pills: Secondary | ICD-10-CM

## 2021-02-27 MED ORDER — LEVONORGESTREL-ETHINYL ESTRAD 0.1-20 MG-MCG PO TABS: 1.0000 | ORAL_TABLET | Freq: Every day | ORAL | 3 refills | Status: DC

## 2021-02-27 NOTE — Patient Instructions (Signed)

## 2021-03-05 LAB — CYTOLOGY - PAP
Comment: NEGATIVE
Diagnosis: NEGATIVE
Diagnosis: REACTIVE
High risk HPV: NEGATIVE

## 2021-04-09 ENCOUNTER — Ambulatory Visit (INDEPENDENT_AMBULATORY_CARE_PROVIDER_SITE_OTHER): Payer: 59 | Admitting: Family Medicine

## 2021-04-09 ENCOUNTER — Other Ambulatory Visit: Payer: Self-pay

## 2021-04-09 ENCOUNTER — Encounter: Payer: Self-pay | Admitting: Family Medicine

## 2021-04-09 VITALS — BP 126/80 | HR 78 | Temp 98.1°F | Ht 66.0 in | Wt 139.4 lb

## 2021-04-09 DIAGNOSIS — Z3041 Encounter for surveillance of contraceptive pills: Secondary | ICD-10-CM | POA: Diagnosis not present

## 2021-04-09 DIAGNOSIS — Z1211 Encounter for screening for malignant neoplasm of colon: Secondary | ICD-10-CM

## 2021-04-09 DIAGNOSIS — Z23 Encounter for immunization: Secondary | ICD-10-CM

## 2021-04-09 DIAGNOSIS — Z1212 Encounter for screening for malignant neoplasm of rectum: Secondary | ICD-10-CM

## 2021-04-09 DIAGNOSIS — Z0000 Encounter for general adult medical examination without abnormal findings: Secondary | ICD-10-CM

## 2021-04-09 LAB — LIPID PANEL
Cholesterol: 175 mg/dL (ref 0–200)
HDL: 48 mg/dL (ref >39.00)
LDL Cholesterol: 107 mg/dL — ABNORMAL HIGH (ref 0–99)
NonHDL: 127.07
Total CHOL/HDL Ratio: 4
Triglycerides: 99 mg/dL (ref 0.0–149.0)
VLDL: 19.8 mg/dL (ref 0.0–40.0)

## 2021-04-09 LAB — COMPREHENSIVE METABOLIC PANEL
ALT: 15 U/L (ref 0–35)
AST: 14 U/L (ref 0–37)
Albumin: 4.5 g/dL (ref 3.5–5.2)
Alkaline Phosphatase: 30 U/L — ABNORMAL LOW (ref 39–117)
BUN: 10 mg/dL (ref 6–23)
CO2: 27 mEq/L (ref 19–32)
Calcium: 8.9 mg/dL (ref 8.4–10.5)
Chloride: 105 mEq/L (ref 96–112)
Creatinine, Ser: 0.66 mg/dL (ref 0.40–1.20)
GFR: 103.13 mL/min (ref >60.00)
Glucose, Bld: 79 mg/dL (ref 70–99)
Potassium: 4.1 mEq/L (ref 3.5–5.1)
Sodium: 138 mEq/L (ref 135–145)
Total Bilirubin: 0.5 mg/dL (ref 0.2–1.2)
Total Protein: 6.9 g/dL (ref 6.0–8.3)

## 2021-04-09 LAB — CBC WITH DIFFERENTIAL/PLATELET
Basophils Absolute: 0 10*3/uL (ref 0.0–0.1)
Basophils Relative: 0.8 % (ref 0.0–3.0)
Eosinophils Absolute: 0 10*3/uL (ref 0.0–0.7)
Eosinophils Relative: 0.7 % (ref 0.0–5.0)
HCT: 42.9 % (ref 36.0–46.0)
Hemoglobin: 14.3 g/dL (ref 12.0–15.0)
Lymphocytes Relative: 32.2 % (ref 12.0–46.0)
Lymphs Abs: 2 10*3/uL (ref 0.7–4.0)
MCHC: 33.3 g/dL (ref 30.0–36.0)
MCV: 93.3 fl (ref 78.0–100.0)
Monocytes Absolute: 0.3 10*3/uL (ref 0.1–1.0)
Monocytes Relative: 4.8 % (ref 3.0–12.0)
Neutro Abs: 3.9 10*3/uL (ref 1.4–7.7)
Neutrophils Relative %: 61.5 % (ref 43.0–77.0)
Platelets: 306 10*3/uL (ref 150.0–400.0)
RBC: 4.6 Mil/uL (ref 3.87–5.11)
RDW: 12.8 % (ref 11.5–15.5)
WBC: 6.3 10*3/uL (ref 4.0–10.5)

## 2021-04-09 NOTE — Progress Notes (Signed)
Subjective  Chief Complaint  Patient presents with   Annual Exam    HPI: Connie Robinson is a 50 y.o. female who presents to [facility] [place] [facility] today for a Female Wellness Visit.   Wellness Visit: annual visit with health maintenance review and exam without Pap  HM needs below:  Health Maintenance  Topic Date Due   COLONOSCOPY (Pts 45-67yr Insurance coverage will need to be confirmed)  Never done   COVID-19 Vaccine (2 - Booster for [employer]) 11/07/2019   TETANUS/TDAP  02/03/2020   INFLUENZA VACCINE  01/13/2021   PAP SMEAR-Modifier  02/27/2026   Hepatitis C Screening  Completed   HIV Screening  Completed   Pneumococcal Vaccine [age]Years old  Aged Out   HPV VACCINES  Aged [employer]. No concerns. Daughter is freshman in college in [place] Son is in 11th grade.  OCPs  Assessment  1. Annual physical exam   2. Oral contraceptive use      Plan  Female Wellness Visit: Age appropriate Health Maintenance and Prevention measures were discussed with patient. Included topics are cancer screening recommendations, ways to keep healthy (see AVS) including dietary and exercise recommendations, regular eye and dental care, use of seat belts, and avoidance of moderate alcohol use and tobacco use. Refer to GI for CRC screen BMI: discussed patient's BMI and encouraged positive lifestyle modifications to help get to or maintain a target BMI. HM needs and immunizations were addressed and ordered. See below for orders. See HM and immunization section for updates. Flu and tetanus given in office today Routine labs and screening tests ordered including cmp, cbc and lipids where appropriate. Discussed recommendations regarding Vit D and calcium supplementation (see AVS)  Follow up: [address]   No orders of the defined types were placed in this encounter.  No orders of the defined types were placed in this encounter.     There is no height or weight on  file to calculate BMI. Wt Readings from Last 3 Encounters:  02/27/21 137 lb (62.1 kg)  01/24/20 136 lb 12.8 oz (62.1 kg)  11/30/19 136 lb (61.7 kg)     Patient Active Problem List   Diagnosis Date Noted   Nephrolithiasis    Health Maintenance  Topic Date Due   COLONOSCOPY (Pts [id number] coverage will need to be confirmed)  Never done   COVID-19 Vaccine (2 - Booster for [employer]) 11/07/2019   TETANUS/TDAP  02/03/2020   INFLUENZA VACCINE  01/13/2021   PAP SMEAR-Modifier  02/27/2026   Hepatitis C Screening  Completed   HIV Screening  Completed   Pneumococcal Vaccine 19-31 38ars old  Aged Out   HPV VACCINES  Aged Out   Immunization History  Administered Date(s) Administered   DTaP 08/20/1972, 07/03/1973, 01/15/1978, 12/12/1982, 12/24/2009   Hepatitis A 03/15/1996, 03/15/1998   Hepatitis B 03/15/1996, 05/15/1996, 11/13/1996   IPV 01/20/1978, 12/12/1982, 12/28/1988, 12/05/1993   Influenza,inj,Quad PF,6+ Mos 03/16/2020   Janssen (J&J) SARS-COV-2 Vaccination 09/12/2019   MMR 04/18/1973   Td 03/15/1996   Varicella 09/02/1973   We updated and reviewed the patient's past history in detail and it is documented below. Allergies: Patient has No Known Allergies. Past Medical History Patient  has a past medical history of Chicken pox, History of BCG vaccination (01/15/1974, 12/12/1982), Mumps, and Nephrolithiasis. Past Surgical History Patient  has a past surgical history that includes Colposcopy (2000). Family History: Patient family history includes Anxiety disorder in her mother;  Diabetes in her paternal grandmother; Healthy in her daughter, father, and son; Hyperlipidemia in her paternal grandfather and paternal grandmother; Hypertension in her mother. Social History:  Patient  reports that she has never smoked. She has never used smokeless tobacco. She reports current alcohol use. She reports that she does not use drugs.  Review of Systems: Constitutional:  negative for fever or malaise Ophthalmic: negative for photophobia, double vision or loss of vision Cardiovascular: negative for chest pain, dyspnea on exertion, or new LE swelling Respiratory: negative for SOB or persistent cough Gastrointestinal: negative for abdominal pain, change in bowel habits or melena Genitourinary: negative for dysuria or gross hematuria, no abnormal uterine bleeding or disharge Musculoskeletal: negative for new gait disturbance or muscular weakness Integumentary: negative for new or persistent rashes, no breast lumps Neurological: negative for TIA or stroke symptoms Psychiatric: negative for SI or delusions Allergic/Immunologic: negative for hives  Patient Care Team    Relationship Specialty Notifications Start End  Leamon Arnt, MD PCP - General Family Medicine  12/06/19   Salvadore Dom, MD Consulting Physician Obstetrics and Gynecology  11/30/19     Objective  Vitals: There were no vitals taken for this visit. General:  Well developed, well nourished, no acute distress  Psych:  Alert and orientedx3,normal mood and affect HEENT:  Normocephalic, atraumatic, non-icteric sclera, supple neck without adenopathy, mass or thyromegaly Cardiovascular:  Normal S1, S2, RRR without gallop, rub or murmur Respiratory:  Good breath sounds bilaterally, CTAB with normal respiratory effort Gastrointestinal: normal bowel sounds, soft, non-tender, no noted masses. No HSM MSK: no deformities, contusions. Joints are without erythema or swelling.  Skin:  Warm, no rashes or suspicious lesions noted  Commons side effects, risks, benefits, and alternatives for medications and treatment plan prescribed today were discussed, and the patient expressed understanding of the given instructions. Patient is instructed to call or message via MyChart if he/she has any questions or concerns regarding our treatment plan. No barriers to understanding were identified. We discussed Red Flag  symptoms and signs in detail. Patient expressed understanding regarding what to do in case of urgent or emergency type symptoms.  Medication list was reconciled, printed and provided to the patient in AVS. Patient instructions and summary information was reviewed with the patient as documented in the AVS. This note was prepared with assistance of Dragon voice recognition software. Occasional wrong-word or sound-a-like substitutions may have occurred due to the inherent limitations of voice recognition software  This visit occurred during the SARS-CoV-2 public health emergency.  Safety protocols were in place, including screening questions prior to the visit, additional usage of staff PPE, and extensive cleaning of exam room while observing appropriate contact time as indicated for disinfecting solutions.

## 2021-04-09 NOTE — Addendum Note (Signed)
Addended by: Katharine Look on: 04/09/2021 10:06 AM   Modules accepted: Orders

## 2021-04-09 NOTE — Patient Instructions (Addendum)
Please return in 12 months for your annual complete physical; please come fasting.   I will release your lab results to you on your MyChart account with further instructions. Please reply with any questions.    We will call you with information regarding your referral appointment. [facility] for colonoscopy.  If you do not hear from [place] within the next 2 weeks, please let me know. It can take 1-2 weeks to get appointments set up with the specialists.    Today you were given your Tdap and flu vaccinations.    If you have any questions or concerns, please don't hesitate to send me a message via MyChart or call the office at [phone]. Thank you for visiting with [place] today! It's our pleasure caring for you.   Take care!! YOGA and Meditation :)

## 2021-05-06 ENCOUNTER — Encounter: Payer: Self-pay | Admitting: Gastroenterology

## 2021-07-28 ENCOUNTER — Other Ambulatory Visit: Payer: Self-pay

## 2021-07-28 ENCOUNTER — Ambulatory Visit (AMBULATORY_SURGERY_CENTER): Payer: 59 | Admitting: *Deleted

## 2021-07-28 VITALS — Ht 66.0 in | Wt 135.0 lb

## 2021-07-28 DIAGNOSIS — Z1211 Encounter for screening for malignant neoplasm of colon: Secondary | ICD-10-CM

## 2021-07-28 MED ORDER — NA SULFATE-K SULFATE-MG SULF 17.5-3.13-1.6 GM/177ML PO SOLN: 1.0000 | Freq: Once | ORAL | 0 refills | Status: AC

## 2021-07-28 NOTE — Progress Notes (Signed)

## 2021-08-07 ENCOUNTER — Encounter: Payer: Self-pay | Admitting: Gastroenterology

## 2021-08-11 ENCOUNTER — Encounter: Payer: Self-pay | Admitting: Gastroenterology

## 2021-08-11 ENCOUNTER — Ambulatory Visit (AMBULATORY_SURGERY_CENTER): Payer: 59 | Admitting: Gastroenterology

## 2021-08-11 ENCOUNTER — Other Ambulatory Visit: Payer: Self-pay

## 2021-08-11 VITALS — BP 139/85 | HR 76 | Temp 98.6°F | Resp 15 | Ht 66.0 in | Wt 135.0 lb

## 2021-08-11 DIAGNOSIS — Z1211 Encounter for screening for malignant neoplasm of colon: Secondary | ICD-10-CM | POA: Diagnosis not present

## 2021-08-11 MED ORDER — SODIUM CHLORIDE 0.9 % IV SOLN
500.0000 mL | Freq: Once | INTRAVENOUS | Status: DC
Start: 2021-08-11 — End: 2021-08-11

## 2021-08-11 NOTE — Progress Notes (Signed)
Sedate, gd SR, tolerated procedure well, VSS, report to RN 

## 2021-08-11 NOTE — Progress Notes (Signed)
Gastroenterology History and Physical   Primary Care Physician:  Willow Ora, MD   Reason for Procedure:  Colorectal cancer screening  Plan:    Screening colonoscopy with possible interventions as needed     HPI: Connie Robinson is a very pleasant 51 y.o. female here for screening colonoscopy. Denies any nausea, vomiting, abdominal pain, melena or bright red blood per rectum  The risks and benefits as well as alternatives of endoscopic procedure(s) have been discussed and reviewed. All questions answered. The patient agrees to proceed.    Past Medical History:  Diagnosis Date   Chicken pox    History of BCG vaccination 01/15/1974, 12/12/1982   Mumps    Nephrolithiasis     Past Surgical History:  Procedure Laterality Date   COLPOSCOPY  2000    Prior to Admission medications   Medication Sig Start Date End Date Taking? Authorizing Provider  calcium citrate-vitamin D (CITRACAL+D) 315-200 MG-UNIT tablet Take 1 tablet by mouth 2 (two) times daily.   Yes [provider]  levonorgestrel-ethinyl estradiol (LARISSIA) 0.1-20 MG-MCG tablet Take 1 tablet by mouth daily. 02/27/21  Yes Romualdo Bolk, MD  Multiple Vitamins-Minerals (MULTIVITAMIN & MINERAL PO) Take by mouth daily.   Yes [provider]  Omega-3 Fatty Acids (FISH OIL PO) Take by mouth daily.   Yes [provider]    Current Outpatient Medications  Medication Sig Dispense Refill   calcium citrate-vitamin D (CITRACAL+D) 315-200 MG-UNIT tablet Take 1 tablet by mouth 2 (two) times daily.     levonorgestrel-ethinyl estradiol (LARISSIA) 0.1-20 MG-MCG tablet Take 1 tablet by mouth daily. 84 tablet 3   Multiple Vitamins-Minerals (MULTIVITAMIN & MINERAL PO) Take by mouth daily.     Omega-3 Fatty Acids (FISH OIL PO) Take by mouth daily.     Current Facility-Administered Medications  Medication Dose Route Frequency Provider Last Rate Last Admin   0.9 %  sodium chloride infusion   500 mL Intravenous Once Napoleon Form, MD        Allergies as of 08/11/2021   (No Known Allergies)    Family History  Problem Relation Age of Onset   Hypertension Mother    Anxiety disorder Mother    Healthy Father    Hyperlipidemia Paternal Grandmother    Diabetes Paternal Grandmother    Hyperlipidemia Paternal Actor    Healthy Daughter    Healthy Son    Cancer Neg Hx    Stroke Neg Hx    Colon cancer Neg Hx    Colon polyps Neg Hx    Esophageal cancer Neg Hx    Rectal cancer Neg Hx    Stomach cancer Neg Hx     Social History   Socioeconomic History   Marital status: Married    Spouse name: Not on file   Number of children: 2   Years of education: 18   Highest education level: Not on file  Occupational History   Occupation: [employer]   Tobacco Use   Smoking status: Never    Passive exposure: Never   Smokeless tobacco: Never  Vaping Use   Vaping Use: Never used  Substance and Sexual Activity   Alcohol use: Yes    Comment: rarely   Drug use: No   Sexual activity: Yes    Partners: Male    Birth control/protection: Pill  Other Topics Concern   Not on file  Social History Narrative   Fun: Ski, work out, travel   Denies religious beliefs effecting health  care.    Denies abuse and feels safe at home.    Social Determinants of Health   Financial Resource Strain: Not on file  Food Insecurity: Not on file  Transportation Needs: Not on file  Physical Activity: Not on file  Stress: Not on file  Social Connections: Not on file  Intimate Partner Violence: Not on file    Review of Systems:  All other review of systems negative except as mentioned in the HPI.  Physical Exam: Vital signs in last 24 hours: BP 129/85 (BP Location: Right Arm, Patient Position: Sitting, Cuff Size: Normal)    Pulse 80    Temp 98.6 F (37 C) (Temporal)    Ht 5\' 6"  (1.676 m)    Wt 135 lb (61.2 kg)    LMP 07/16/2021 (Approximate)    SpO2 99%    BMI 21.79 kg/m  General:    Alert, NAD Lungs:  Clear .   Heart:  Regular rate and rhythm Abdomen:  Soft, nontender and nondistended. Neuro/Psych:  Alert and cooperative. Normal mood and affect. A and O x 3  Reviewed labs, radiology imaging, old records and pertinent past GI work up  Patient is appropriate for planned procedure(s) and anesthesia in an ambulatory setting   K. 09/13/2021 , MD [phone]

## 2021-08-11 NOTE — Progress Notes (Signed)
Vitals-NS  Pt's states no medical or surgical changes since previsit or office visit. 

## 2021-08-11 NOTE — Patient Instructions (Signed)
Please see handouts on hemorrhoids.   YOU HAD AN ENDOSCOPIC PROCEDURE TODAY AT THE [facility]:   Refer to the procedure report that was given to you for any specific questions about what was found during the examination.  If the procedure report does not answer your questions, please call your gastroenterologist to clarify.  If you requested that your care partner not be given the details of your procedure findings, then the procedure report has been included in a sealed envelope for you to review at your convenience later.  YOU SHOULD EXPECT: Some feelings of bloating in the abdomen. Passage of more gas than usual.  Walking can help get rid of the air that was put into your GI tract during the procedure and reduce the bloating. If you had a lower endoscopy (such as a colonoscopy or flexible sigmoidoscopy) you may notice spotting of blood in your stool or on the toilet paper. If you underwent a bowel prep for your procedure, you may not have a normal bowel movement for a few days.  Please Note:  You might notice some irritation and congestion in your nose or some drainage.  This is from the oxygen used during your procedure.  There is no need for concern and it should clear up in a day or so.  SYMPTOMS TO REPORT IMMEDIATELY:  Following lower endoscopy (colonoscopy or flexible sigmoidoscopy):  Excessive amounts of blood in the stool  Significant tenderness or worsening of abdominal pains  Swelling of the abdomen that is new, acute  Fever of 100F or higher    For urgent or emergent issues, a gastroenterologist can be reached at any hour by calling [phone]. Do not use MyChart messaging for urgent concerns.    DIET:  We do recommend a small meal at first, but then you may proceed to your regular diet.  Drink plenty of fluids but you should avoid alcoholic beverages for 24 hours.  ACTIVITY:  You should plan to take it easy for the rest of today and you should NOT DRIVE or use  heavy machinery until tomorrow (because of the sedation medicines used during the test).    FOLLOW UP: Our staff will call the number listed on your records 48-72 hours following your procedure to check on you and address any questions or concerns that you may have regarding the information given to you following your procedure. If we do not reach you, we will leave a message.  We will attempt to reach you two times.  During this call, we will ask if you have developed any symptoms of COVID 19. If you develop any symptoms (ie: fever, flu-like symptoms, shortness of breath, cough etc.) before then, please call [phone].  If you test positive for Covid 19 in the 2 weeks post procedure, please call and report this information to [place].    If any biopsies were taken you will be contacted by phone or by letter within the next 1-3 weeks.  Please call us at [phone] if you have not heard about the biopsies in 3 weeks.    SIGNATURES/CONFIDENTIALITY: You and/or your care partner have signed paperwork which will be entered into your electronic medical record.  These signatures attest to the fact that that the information above on your After Visit Summary has been reviewed and is understood.  Full responsibility of the confidentiality of this discharge information lies with you and/or your care-partner.

## 2021-08-11 NOTE — Op Note (Signed)
[facility] Patient Name: Connie Robinson Procedure Date: 08/11/2021 7:30 AM MRN: [id number] Endoscopist: Napoleon Form , MD Age: 51 Referring MD:  Date of Birth: [birth year] Gender: Female Account #: [id number] Procedure:                Colonoscopy Indications:              Screening for colorectal malignant neoplasm Medicines:                Monitored Anesthesia Care Procedure:                Pre-Anesthesia Assessment:                           - Prior to the procedure, a History and Physical                            was performed, and patient medications and                            allergies were reviewed. The patient's tolerance of                            previous anesthesia was also reviewed. The risks                            and benefits of the procedure and the sedation                            options and risks were discussed with the patient.                            All questions were answered, and informed consent                            was obtained. Prior Anticoagulants: The patient has                            taken no previous anticoagulant or antiplatelet                            agents. ASA Grade Assessment: I - A normal, healthy                            patient. After reviewing the risks and benefits,                            the patient was deemed in satisfactory condition to                            undergo the procedure.                           After obtaining informed consent, the colonoscope  was passed under direct vision. Throughout the                            procedure, the patient's blood pressure, pulse, and                            oxygen saturations were monitored continuously. The                            Olympus PCF-H190DL (#[id number]) Colonoscope was                            introduced through the anus and advanced to the the                            cecum, identified by  appendiceal orifice and                            ileocecal valve. The colonoscopy was performed                            without difficulty. The patient tolerated the                            procedure well. The quality of the bowel                            preparation was excellent. The ileocecal valve,                            appendiceal orifice, and rectum were photographed. Scope In: 8:12:11 AM Scope Out: 8:24:36 AM Scope Withdrawal Time: 0 hours 9 minutes 3 seconds  Total Procedure Duration: 0 hours 12 minutes 25 seconds  Findings:                 The perianal and digital rectal examinations were                            normal.                           The colon (entire examined portion) appeared normal.                           Non-bleeding internal hemorrhoids were found during                            retroflexion. The hemorrhoids were small. Complications:            No immediate complications. Estimated Blood Loss:     Estimated blood loss: none. Impression:               - The entire examined colon is normal.                           - Non-bleeding internal hemorrhoids.                           -  No specimens collected. Recommendation:           - Patient has a contact number available for                            emergencies. The signs and symptoms of potential                            delayed complications were discussed with the                            patient. Return to normal activities tomorrow.                            Written discharge instructions were provided to the                            patient.                           - Resume previous diet.                           - Continue present medications.                           - Repeat colonoscopy in 10 years for surveillance. Napoleon Form, MD 08/11/2021 8:28:43 AM This report has been signed electronically.

## 2021-08-13 ENCOUNTER — Telehealth: Payer: Self-pay | Admitting: *Deleted

## 2021-08-13 NOTE — Telephone Encounter (Signed)
?  Follow up Call- ? ?Call back number 08/11/2021  ?Post procedure Call Back phone  # [phone]  ?Permission to leave phone message Yes  ?Some recent data might be hidden  ?  ? ?Patient questions: ? ?Do you have a fever, pain , or abdominal swelling? No. ?Pain Score  0 * ? ?Have you tolerated food without any problems? Yes.   ? ?Have you been able to return to your normal activities? Yes.   ? ?Do you have any questions about your discharge instructions: ?Diet   No. ?Medications  No. ?Follow up visit  No. ? ?Do you have questions or concerns about your Care? No. ? ?Actions: ?* If pain score is 4 or above: ?No action needed, pain <4. ? ? ?

## 2022-01-04 ENCOUNTER — Other Ambulatory Visit: Payer: Self-pay | Admitting: Obstetrics and Gynecology

## 2022-01-04 DIAGNOSIS — Z3041 Encounter for surveillance of contraceptive pills: Secondary | ICD-10-CM

## 2022-01-05 NOTE — Telephone Encounter (Signed)
Last annual exam 02/2021 Scheduled on 03/11/22

## 2022-01-16 ENCOUNTER — Other Ambulatory Visit: Payer: Self-pay | Admitting: Obstetrics and Gynecology

## 2022-01-16 DIAGNOSIS — Z1231 Encounter for screening mammogram for malignant neoplasm of breast: Secondary | ICD-10-CM

## 2022-01-28 ENCOUNTER — Ambulatory Visit
Admission: RE | Admit: 2022-01-28 | Discharge: 2022-01-28 | Disposition: A | Discharge status: Home / Self Care | Payer: 59 | Source: Ambulatory Visit | Attending: Obstetrics and Gynecology | Admitting: Obstetrics and Gynecology

## 2022-01-28 DIAGNOSIS — Z1231 Encounter for screening mammogram for malignant neoplasm of breast: Secondary | ICD-10-CM

## 2022-03-02 NOTE — Progress Notes (Signed)
51 y.o. G2P2 Married White or Caucasian Not Hispanic or Latino female here for annual exam.   On OCP's. No dyspareunia.  Period Cycle (Days): 28 Period Duration (Days): 3-4 Period Pattern: Regular Menstrual Flow: Light Menstrual Control: Panty liner Menstrual Control Change Freq (Hours): 8-10 Dysmenorrhea: None  Patient's last menstrual period was 02/26/2022.          Sexually active: Yes.    The current method of family planning is OCP (estrogen/progesterone).    Exercising: Yes.     Yoga  Smoker:  no  Health Maintenance: Pap:  02/27/21 WNL Hr Hpv Neg, 11/30/19 WNL Hr HPV Neg History of abnormal Pap:  Yes in her 20's She had a colposcopy.  MMG:  01/28/22 density C Bi-rads 1 neg  BMD:   n/a Colonoscopy: 08/11/21 normal f/u 10 years  TDaP:  04/09/21  Gardasil: n/a   reports that she has never smoked. She has never been exposed to tobacco smoke. She has never used smokeless tobacco. She reports current alcohol use. She reports that she does not use drugs. 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 Daughter is a Medical laboratory scientific officer in college in [place], Public relations account executive major. Son is a Holiday representative in [employer].   Past Medical History:  Diagnosis Date   Chicken pox    History of BCG vaccination 01/15/1974, 12/12/1982   Mumps    Nephrolithiasis     Past Surgical History:  Procedure Laterality Date   COLPOSCOPY  2000    Current Outpatient Medications  Medication Sig Dispense Refill   AVIANE 0.1-20 MG-MCG tablet TAKE 1 TABLET BY MOUTH  DAILY 84 tablet 0   calcium citrate-vitamin D (CITRACAL+D) 315-200 MG-UNIT tablet Take 1 tablet by mouth 2 (two) times daily.     Multiple Vitamins-Minerals (MULTIVITAMIN & MINERAL PO) Take by mouth daily.     Omega-3 Fatty Acids (FISH OIL PO) Take by mouth daily.     No current facility-administered medications for this visit.    Family History  Problem Relation Age of Onset   Hypertension Mother    Anxiety disorder Mother    Healthy Father    Hyperlipidemia  Paternal Grandmother    Diabetes Paternal Grandmother    Hyperlipidemia Paternal Actor    Healthy Daughter    Healthy Son    Cancer Neg Hx    Stroke Neg Hx    Colon cancer Neg Hx    Colon polyps Neg Hx    Esophageal cancer Neg Hx    Rectal cancer Neg Hx    Stomach cancer Neg Hx     Review of Systems  All other systems reviewed and are negative.   Exam:   BP 122/82   Pulse 88   Ht 5' 5.5" (1.664 m)   Wt 144 lb 6.4 oz (65.5 kg)   LMP 02/26/2022   SpO2 99%   BMI 23.66 kg/m   Weight change: @WEIGHTCHANGE @ Height:   Height: 5' 5.5" (166.4 cm)  Ht Readings from Last 3 Encounters:  03/11/22 5' 5.5" (1.664 m)  08/11/21 5\' 6"  (1.676 m)  07/28/21 5\' 6"  (1.676 m)    General appearance: alert, cooperative and appears stated age Head: Normocephalic, without obvious abnormality, atraumatic Neck: no adenopathy, supple, symmetrical, trachea midline and thyroid normal to inspection and palpation Lungs: clear to auscultation bilaterally Cardiovascular: regular rate and rhythm Breasts: normal appearance, no masses or tenderness Abdomen: soft, non-tender; non distended,  no masses,  no organomegaly Extremities: extremities normal, atraumatic, no cyanosis or edema Skin: Skin  color, texture, turgor normal. No rashes or lesions Lymph nodes: Cervical, supraclavicular, and axillary nodes normal. No abnormal inguinal nodes palpated Neurologic: Grossly normal   Pelvic: External genitalia:  no lesions              Urethra:  normal appearing urethra with no masses, tenderness or lesions              Bartholins and Skenes: normal                 Vagina: normal appearing vagina with normal color and discharge, no lesions              Cervix: no lesions               Bimanual Exam:  Uterus:  normal size, contour, position, consistency, mobility, non-tender              Adnexa: no mass, fullness, tenderness               Rectovaginal: Confirms               Anus:  normal sphincter tone,  no lesions  Gae Dry, CMA chaperoned for the exam.  1. Well woman exam Discussed breast self exam Discussed calcium and vit D intake Mammogram and colonoscopy are UTD Labs with primary  2. Encounter for surveillance of contraceptive pills Doing well - levonorgestrel-ethinyl estradiol (AVIANE) 0.1-20 MG-MCG tablet; Take 1 tablet by mouth daily.  Dispense: 84 tablet; Refill: 3

## 2022-03-09 ENCOUNTER — Ambulatory Visit: Payer: 59 | Admitting: Obstetrics and Gynecology

## 2022-03-09 ENCOUNTER — Encounter: Payer: Self-pay | Admitting: *Deleted

## 2022-03-11 ENCOUNTER — Ambulatory Visit (INDEPENDENT_AMBULATORY_CARE_PROVIDER_SITE_OTHER): Payer: 59 | Admitting: Obstetrics and Gynecology

## 2022-03-11 ENCOUNTER — Encounter: Payer: Self-pay | Admitting: Obstetrics and Gynecology

## 2022-03-11 VITALS — BP 122/82 | HR 88 | Ht 65.5 in | Wt 144.4 lb

## 2022-03-11 DIAGNOSIS — Z01419 Encounter for gynecological examination (general) (routine) without abnormal findings: Secondary | ICD-10-CM

## 2022-03-11 DIAGNOSIS — Z3041 Encounter for surveillance of contraceptive pills: Secondary | ICD-10-CM | POA: Diagnosis not present

## 2022-03-11 MED ORDER — LEVONORGESTREL-ETHINYL ESTRAD 0.1-20 MG-MCG PO TABS: 1.0000 | ORAL_TABLET | Freq: Every day | ORAL | 3 refills | Status: DC

## 2022-03-11 NOTE — Patient Instructions (Signed)

## 2022-04-13 ENCOUNTER — Encounter: Payer: 59 | Admitting: Family Medicine

## 2022-05-28 ENCOUNTER — Encounter: Payer: Self-pay | Admitting: *Deleted

## 2022-07-08 ENCOUNTER — Encounter: Payer: Self-pay | Admitting: Family Medicine

## 2022-07-08 ENCOUNTER — Ambulatory Visit (INDEPENDENT_AMBULATORY_CARE_PROVIDER_SITE_OTHER): Payer: 59 | Admitting: Family Medicine

## 2022-07-08 VITALS — BP 118/82 | HR 87 | Temp 98.0°F | Resp 16 | Ht 66.0 in | Wt 142.2 lb

## 2022-07-08 DIAGNOSIS — M7582 Other shoulder lesions, left shoulder: Secondary | ICD-10-CM | POA: Diagnosis not present

## 2022-07-08 DIAGNOSIS — Z0000 Encounter for general adult medical examination without abnormal findings: Secondary | ICD-10-CM

## 2022-07-08 DIAGNOSIS — Z7185 Encounter for immunization safety counseling: Secondary | ICD-10-CM

## 2022-07-08 DIAGNOSIS — Z23 Encounter for immunization: Secondary | ICD-10-CM

## 2022-07-08 DIAGNOSIS — Z3041 Encounter for surveillance of contraceptive pills: Secondary | ICD-10-CM | POA: Diagnosis not present

## 2022-07-08 LAB — COMPREHENSIVE METABOLIC PANEL
ALT: 19 U/L (ref 0–35)
AST: 14 U/L (ref 0–37)
Albumin: 4.7 g/dL (ref 3.5–5.2)
Alkaline Phosphatase: 33 U/L — ABNORMAL LOW (ref 39–117)
BUN: 12 mg/dL (ref 6–23)
CO2: 27 mEq/L (ref 19–32)
Calcium: 9.6 mg/dL (ref 8.4–10.5)
Chloride: 102 mEq/L (ref 96–112)
Creatinine, Ser: 0.67 mg/dL (ref 0.40–1.20)
GFR: 101.87 mL/min (ref >60.00)
Glucose, Bld: 82 mg/dL (ref 70–99)
Potassium: 4.3 mEq/L (ref 3.5–5.1)
Sodium: 138 mEq/L (ref 135–145)
Total Bilirubin: 0.4 mg/dL (ref 0.2–1.2)
Total Protein: 7.1 g/dL (ref 6.0–8.3)

## 2022-07-08 LAB — CBC WITH DIFFERENTIAL/PLATELET
Basophils Absolute: 0 10*3/uL (ref 0.0–0.1)
Basophils Relative: 0.7 % (ref 0.0–3.0)
Eosinophils Absolute: 0 10*3/uL (ref 0.0–0.7)
Eosinophils Relative: 0.4 % (ref 0.0–5.0)
HCT: 45.6 % (ref 36.0–46.0)
Hemoglobin: 15.3 g/dL — ABNORMAL HIGH (ref 12.0–15.0)
Lymphocytes Relative: 29.5 % (ref 12.0–46.0)
Lymphs Abs: 2.1 10*3/uL (ref 0.7–4.0)
MCHC: 33.6 g/dL (ref 30.0–36.0)
MCV: 93.6 fl (ref 78.0–100.0)
Monocytes Absolute: 0.4 10*3/uL (ref 0.1–1.0)
Monocytes Relative: 5.1 % (ref 3.0–12.0)
Neutro Abs: 4.5 10*3/uL (ref 1.4–7.7)
Neutrophils Relative %: 64.3 % (ref 43.0–77.0)
Platelets: 356 10*3/uL (ref 150.0–400.0)
RBC: 4.87 Mil/uL (ref 3.87–5.11)
RDW: 12.7 % (ref 11.5–15.5)
WBC: 7 10*3/uL (ref 4.0–10.5)

## 2022-07-08 LAB — LIPID PANEL
Cholesterol: 188 mg/dL (ref 0–200)
HDL: 44.5 mg/dL (ref >39.00)
LDL Cholesterol: 123 mg/dL — ABNORMAL HIGH (ref 0–99)
NonHDL: 143.96
Total CHOL/HDL Ratio: 4
Triglycerides: 105 mg/dL (ref 0.0–149.0)
VLDL: 21 mg/dL (ref 0.0–40.0)

## 2022-07-08 MED ORDER — DICLOFENAC SODIUM 75 MG PO TBEC: 75.0000 mg | DELAYED_RELEASE_TABLET | Freq: Two times a day (BID) | ORAL | 0 refills | Status: DC

## 2022-07-08 NOTE — Patient Instructions (Signed)
Please return in 12 months for your annual complete physical; please come fasting.   I will release your lab results to you on your MyChart account with further instructions. You may see the results before I do, but when I review them I will send you a message with my report or have my assistant call you if things need to be discussed. Please reply to my message with any questions. Thank you!   Today you were given a flu vaccination.   If you have any questions or concerns, please don't hesitate to send me a message via MyChart or call the office at [phone]. Thank you for visiting with [place] today! It's our pleasure caring for you.   Rotator Cuff Tendinitis  Rotator cuff tendinitis is inflammation of the tendons in the rotator cuff. Tendons are tough, cord-like bands that connect muscle to bone. The rotator cuff includes all of the muscles and tendons that connect the arm to the shoulder. The rotator cuff holds the head of the humerus, or the upper arm bone, in the cup of the shoulder blade (scapula). This condition can lead to a long-term or chronic tear. The tear may be partial or complete. What are the causes? This condition is usually caused by overusing the rotator cuff. What increases the risk? This condition is more likely to develop in athletes and workers who frequently use their shoulder or reach over their heads. This can include activities such as: Tennis. Baseball or softball. Swimming. Construction work. Painting. What are the signs or symptoms? Symptoms of this condition include: Pain that spreads (radiates) from the shoulder to the upper arm. Swelling and tenderness in front of the shoulder. Pain when reaching, pulling, or lifting the arm above the head. Pain when lowering the arm from above the head. Minor pain in the shoulder when resting. Increased pain in the shoulder at night. Difficulty placing the arm behind the back. How is this diagnosed? This condition is  diagnosed with a physical exam and medical history. Tests may also be done, including: X-rays. MRI. Ultrasound. CT with or without contrast. How is this treated? Treatment for this condition depends on the severity of the condition. In less severe cases, treatment may include: Rest. This may be done with a sling that holds the shoulder still (immobilization). Your health care provider may also recommend avoiding activities that involve lifting your arm over your head. Icing the shoulder. Anti-inflammatory medicines, such as aspirin or ibuprofen. In more severe cases, treatment may include: Physical therapy. Steroid injections. Surgery. Follow these instructions at home: If you have a sling: Wear the sling as told by your health care provider. Remove it only as told by your health care provider. Loosen it if your fingers tingle, become numb, or turn cold and blue. Keep it clean. If the sling is not waterproof: Do not let it get wet. Cover it with a watertight covering when you take a bath or shower. Managing pain, stiffness, and swelling  If directed, put ice on the injured area. To do this: If you have a removable sling, remove it as told by your health care provider. Put ice in a plastic bag. Place a towel between your skin and the bag. Leave the ice on for 20 minutes, 2-3 times a day. Move your fingers often to reduce stiffness and swelling. Raise (elevate) the injured area above the level of your heart while you are lying down. Find a comfortable sleeping position, or sleep in a recliner, if available.  Activity Rest your shoulder as told by your health care provider. Ask your health care provider when it is safe to drive if you have a sling on your arm. Return to your normal activities as told by your health care provider. Ask your health care provider what activities are safe for you. Do any exercises or stretches as told by your health care provider or physical therapist. If  you do repetitive overhead tasks, take small breaks in between and include stretching exercises as told by your health care provider. General instructions Do not use any products that contain nicotine or tobacco, such as cigarettes, e-cigarettes, and chewing tobacco. These can delay healing. If you need help quitting, ask your health care provider. Take over-the-counter and prescription medicines only as told by your health care provider. Keep all follow-up visits as told by your health care provider. This is important. Contact a health care provider if: Your pain gets worse. You have new pain in your arm, hands, or fingers. Your pain is not relieved with medicine or does not get better after 6 weeks of treatment. You have crackling sensations when moving your shoulder in certain directions. You hear a snapping sound after using your shoulder, followed by severe pain and weakness. Get help right away if: Your arm, hand, or fingers are numb or tingling. Your arm, hand, or fingers are swollen or painful or they turn white or blue. Summary Rotator cuff tendinitis is inflammation of the tendons in the rotator cuff. Tendons are tough, cord-like bands that connect muscle to bone. This condition is usually caused by overusing the rotator cuff, which includes all of the muscles and tendons that connect the arm to the shoulder. This condition is more likely to develop in athletes and workers who frequently use their shoulder or reach over their heads. Treatment generally includes rest, anti-inflammatory medicines, and icing. In some cases, physical therapy and steroid injections may be needed. In severe cases, surgery may be needed. This information is not intended to replace advice given to you by your health care provider. Make sure you discuss any questions you have with your health care provider. Document Revised: 03/06/2019 Document Reviewed: 03/06/2019 Elsevier Patient Education  [place].

## 2022-07-08 NOTE — Progress Notes (Signed)
Subjective  Chief Complaint  Patient presents with   Annual Exam    Fasting  Flu shot today in office  Would like to discuss shingle vaccine today    Shoulder Pain    Left shoulder only, worse after sleeping on it     HPI: Connie Robinson is a 52 y.o. female who presents to [facility] [place] today for a Female Wellness Visit. She also has the concerns and/or needs as listed above in the chief complaint. These will be addressed in addition to the Health Maintenance Visit.   Wellness Visit: annual visit with health maintenance review and exam without Pap  HM: sees Dr. Talbert Nan for female wellness. Screens are all current. Reviewed normal colonoscopy 2023 with Dr. Silverio Decamp. Mammo and pap normal. Healthy. Not exercising regularly.  Imms: flu today; eligible for shingrix but defers. Would like to wait to push out immunity.  Chronic disease f/u and/or acute problem visit: (deemed necessary to be done in addition to the wellness visit): C/o 1-2 months of left shoulder pain. No injury or obvious overuse. Painful with sleeping on that side. Had decreased ROM at first but has worked on that to improve it. Now sore to touch at certain places. Feels it is posturally related. No neck or upper back pain. Works at a computer during the day. Hasn't taken any meds. No prior shoulder problems.   Assessment  1. Annual physical exam   2. Oral contraceptive use   3. Rotator cuff tendonitis, left   4. Vaccine counseling      Plan  Female Wellness Visit: Age appropriate Health Maintenance and Prevention measures were discussed with patient. Included topics are cancer screening recommendations, ways to keep healthy (see AVS) including dietary and exercise recommendations, regular eye and dental care, use of seat belts, and avoidance of moderate alcohol use and tobacco use.  BMI: discussed patient's BMI and encouraged positive lifestyle modifications to help get to or maintain a  target BMI. HM needs and immunizations were addressed and ordered. See below for orders. See HM and immunization section for updates. Defer shingrix. Flu given today Routine labs and screening tests ordered including cmp, cbc and lipids where appropriate. Discussed recommendations regarding Vit D and calcium supplementation (see AVS)  Chronic disease management visit and/or acute problem visit: Left rotator cuff tendonopathy: education given. Rec RICE, voltaren x 1-2 weeks and strengthening exercises once pain free. F/u if not resolved.  OCP use: will discuss stopping with gyn in next 1-2 years. Pt is hesitant. No AE.  Follow up: 12 mo for cpe  Orders Placed This Encounter  Procedures   CBC with Differential/Platelet   Comprehensive metabolic panel   Lipid panel   Meds ordered this encounter  Medications   diclofenac (VOLTAREN) 75 MG EC tablet    Sig: Take 1 tablet (75 mg total) by mouth 2 (two) times daily.    Dispense:  30 tablet    Refill:  0      Body mass index is 22.95 kg/m. Wt Readings from Last 3 Encounters:  07/08/22 142 lb 3.2 oz (64.5 kg)  03/11/22 144 lb 6.4 oz (65.5 kg)  08/11/21 135 lb (61.2 kg)     Patient Active Problem List   Diagnosis Date Noted   Nephrolithiasis    Health Maintenance  Topic Date Due   INFLUENZA VACCINE  01/13/2022   Zoster Vaccines- Shingrix (1 of 2) 10/07/2022 (Originally 01/11/2022)   MAMMOGRAM  01/29/2023   PAP  SMEAR-Modifier  02/27/2026   DTaP/Tdap/Td (8 - Td or Tdap) 04/10/2031   COLONOSCOPY (Pts 45-72yrs Insurance coverage will need to be confirmed)  08/12/2031   Hepatitis C Screening  Completed   HIV Screening  Completed   HPV VACCINES  Aged Out   COVID-19 Vaccine  Discontinued   Immunization History  Administered Date(s) Administered   DTaP 08/20/1972, 07/03/1973, 01/15/1978, 12/12/1982, 12/24/2009   Hepatitis A 03/15/1996, 03/15/1998   Hepatitis B 03/15/1996, 05/15/1996, 11/13/1996   IPV 01/20/1978, 12/12/1982,  12/28/1988, 12/05/1993   Influenza,inj,Quad PF,6+ Mos 03/16/2020, 04/09/2021   Janssen (J&J) SARS-COV-2 Vaccination 09/12/2019   MMR 04/18/1973   Moderna Covid-19 Vaccine Bivalent Booster 64yrs & up 02/21/2021   Moderna Sars-Covid-2 Vaccination 04/19/2020   Td 03/15/1996   Tdap 04/09/2021   Varicella 09/02/1973   We updated and reviewed the patient's past history in detail and it is documented below. Allergies: Patient has No Known Allergies. Past Medical History Patient  has a past medical history of Chicken pox, History of BCG vaccination (01/15/1974, 12/12/1982), Mumps, and Nephrolithiasis. Past Surgical History Patient  has a past surgical history that includes Colposcopy (2000). Family History: Patient family history includes Anxiety disorder in her mother; Diabetes in her paternal grandmother; Healthy in her daughter, father, and son; Hyperlipidemia in her paternal grandfather and paternal grandmother; Hypertension in her mother. Social History:  Patient  reports that she has never smoked. She has never been exposed to tobacco smoke. She has never used smokeless tobacco. She reports current alcohol use. She reports that she does not use drugs.  Review of Systems: Constitutional: negative for fever or malaise Ophthalmic: negative for photophobia, double vision or loss of vision Cardiovascular: negative for chest pain, dyspnea on exertion, or new LE swelling Respiratory: negative for SOB or persistent cough Gastrointestinal: negative for abdominal pain, change in bowel habits or melena Genitourinary: negative for dysuria or gross hematuria, no abnormal uterine bleeding or disharge Musculoskeletal: negative for new gait disturbance or muscular weakness Integumentary: negative for new or persistent rashes, no breast lumps Neurological: negative for TIA or stroke symptoms Psychiatric: negative for SI or delusions Allergic/Immunologic: negative for hives  Patient Care Team     Relationship Specialty Notifications Start End  Leamon Arnt, MD PCP - General Family Medicine  12/06/19   Salvadore Dom, MD Consulting Physician Obstetrics and Gynecology  11/30/19   Mauri Pole, MD Consulting Physician Gastroenterology  07/08/22     Objective  Vitals: BP 118/82   Pulse 87   Temp 98 F (36.7 C) (Temporal)   Resp 16   Ht 5\' 6"  (1.676 m)   Wt 142 lb 3.2 oz (64.5 kg)   SpO2 95%   BMI 22.95 kg/m  General:  Well developed, well nourished, no acute distress  Psych:  Alert and orientedx3,normal mood and affect HEENT:  Normocephalic, atraumatic, non-icteric sclera,  supple neck without adenopathy, mass or thyromegaly Cardiovascular:  Normal S1, S2, RRR without gallop, rub or murmur Respiratory:  Good breath sounds bilaterally, CTAB with normal respiratory effort Gastrointestinal: normal bowel sounds, soft, non-tender, no noted masses. No HSM MSK: no deformities, contusions. Joints are without erythema or swelling.  Left shoulder full AROM and PROM. Ttp over subacromial bursa. + empty can test. No neck ttp or trap ttp.  Skin:  Warm, no rashes or suspicious lesions noted Neurologic:    Mental status is normal. Gross motor and sensory exams are normal. Normal gait. No tremor   Commons side effects, risks, benefits, and  alternatives for medications and treatment plan prescribed today were discussed, and the patient expressed understanding of the given instructions. Patient is instructed to call or message via MyChart if he/she has any questions or concerns regarding our treatment plan. No barriers to understanding were identified. We discussed Red Flag symptoms and signs in detail. Patient expressed understanding regarding what to do in case of urgent or emergency type symptoms.  Medication list was reconciled, printed and provided to the patient in AVS. Patient instructions and summary information was reviewed with the patient as documented in the AVS. This note  was prepared with assistance of Dragon voice recognition software. Occasional wrong-word or sound-a-like substitutions may have occurred due to the inherent limitations of voice recognition software

## 2022-09-30 ENCOUNTER — Telehealth: Payer: Self-pay | Admitting: Family Medicine

## 2022-09-30 NOTE — Telephone Encounter (Signed)
Form has been faxed.

## 2022-09-30 NOTE — Telephone Encounter (Signed)
Forms has been place in andy's box

## 2022-09-30 NOTE — Telephone Encounter (Signed)
Patient dropped off document  Physical Results Form , to be filled out by provider. Patient requested to send it via Fax within 5-days. Document is located in providers tray at front office.Please advise at Mobile [phone] (mobile)

## 2022-10-05 ENCOUNTER — Ambulatory Visit (INDEPENDENT_AMBULATORY_CARE_PROVIDER_SITE_OTHER): Payer: 59 | Admitting: Family Medicine

## 2022-10-05 VITALS — BP 174/104 | HR 87 | Temp 98.2°F | Ht 66.0 in | Wt 142.8 lb

## 2022-10-05 DIAGNOSIS — R030 Elevated blood-pressure reading, without diagnosis of hypertension: Secondary | ICD-10-CM

## 2022-10-05 DIAGNOSIS — R21 Rash and other nonspecific skin eruption: Secondary | ICD-10-CM

## 2022-10-05 MED ORDER — TRIAMCINOLONE ACETONIDE 0.1 % EX CREA: 1.0000 | TOPICAL_CREAM | Freq: Two times a day (BID) | CUTANEOUS | 0 refills | Status: DC

## 2022-10-05 NOTE — Progress Notes (Signed)
Subjective  CC:  Chief Complaint  Patient presents with   Rash    Pt has a rash on her Lt lower back that has been there for the past week. Just itchy with no pain    HPI: Connie Robinson is a 52 y.o. female who presents to the office today to address the problems listed above in the chief complaint. Healthy 52 year old female presents after noticing a red itchy rash on her left back.  Blistering rash.  Has pictures.  No systemic symptoms.  Mild itching.  Has been using over-the-counter hydrocortisone cream.  No pain or nerve like paresthesias.  Has not been vaccinated against shingles.  Has been working outside.  No history of poison ivy in the past.  Rash is not spreading. No history of hypertension but blood pressures are very elevated today.  She did say she was speaking to her daughter prior to this visit and is a little bit stressed.  Otherwise feels fine BP Readings from Last 3 Encounters:  10/05/22 (!) 174/104  07/08/22 118/82  03/11/22 122/82    Assessment  1. Rash and nonspecific skin eruption   2. Elevated blood pressure reading without diagnosis of hypertension      Plan  Rash: Looks like shingles however patient has very little pain or symptoms.  Could also be a contact dermatitis.  Education counseling done.  She is outside the window of needing treatment for shingles.  Will treat like dermatitis with triamcinolone cream twice daily.  Should improve. Patient to monitor blood pressure outside of the office.  She will follow-up with me if remains elevated.  Likely stress-induced  Follow up: As needed if not proving Visit date not found  No orders of the defined types were placed in this encounter.  Meds ordered this encounter  Medications   triamcinolone cream (KENALOG) 0.1 %    Sig: Apply 1 Application topically 2 (two) times daily. For 1-2 weeks    Dispense:  30 g    Refill:  0      I reviewed the patients updated PMH, FH, and SocHx.    Patient Active  Problem List   Diagnosis Date Noted   Nephrolithiasis    Current Meds  Medication Sig   calcium citrate-vitamin D (CITRACAL+D) 315-200 MG-UNIT tablet Take 1 tablet by mouth 2 (two) times daily.   diclofenac (VOLTAREN) 75 MG EC tablet Take 1 tablet (75 mg total) by mouth 2 (two) times daily.   levonorgestrel-ethinyl estradiol (AVIANE) 0.1-20 MG-MCG tablet Take 1 tablet by mouth daily.   Multiple Vitamins-Minerals (MULTIVITAMIN & MINERAL PO) Take by mouth daily.   Omega-3 Fatty Acids (FISH OIL PO) Take by mouth daily.   triamcinolone cream (KENALOG) 0.1 % Apply 1 Application topically 2 (two) times daily. For 1-2 weeks    Allergies: Patient has No Known Allergies. Family History: Patient family history includes Anxiety disorder in her mother; Diabetes in her paternal grandmother; Healthy in her daughter, father, and son; Hyperlipidemia in her paternal grandfather and paternal grandmother; Hypertension in her mother. Social History:  Patient  reports that she has never smoked. She has never been exposed to tobacco smoke. She has never used smokeless tobacco. She reports current alcohol use. She reports that she does not use drugs.  Review of Systems: Constitutional: Negative for fever malaise or anorexia Cardiovascular: negative for chest pain Respiratory: negative for SOB or persistent cough Gastrointestinal: negative for abdominal pain  Objective  Vitals: BP (!) 174/104   Pulse 87  Temp 98.2 F (36.8 C)   Ht 5' 6" (1.676 m)   Wt 142 lb 12.8 oz (64.8 kg)   SpO2 98%   BMI 23.05 kg/m  General: no acute distress , A&Ox3 HEENT: PEERL, conjunctiva normal, neck is supple Cardiovascular:  RRR without murmur or gallop.  Respiratory:  Good breath sounds bilaterally, CTAB with normal respiratory effort Skin:  Warm, left back with vesicular eruption on erythematous base in dermatomal pattern.  No crusting  Commons side effects, risks, benefits, and alternatives for medications and  treatment plan prescribed today were discussed, and the patient expressed understanding of the given instructions. Patient is instructed to call or message via MyChart if he/she has any questions or concerns regarding our treatment plan. No barriers to understanding were identified. We discussed Red Flag symptoms and signs in detail. Patient expressed understanding regarding what to do in case of urgent or emergency type symptoms.  Medication list was reconciled, printed and provided to the patient in AVS. Patient instructions and summary information was reviewed with the patient as documented in the AVS. This note was prepared with assistance of Dragon voice recognition software. Occasional wrong-word or sound-a-like substitutions may have occurred due to the inherent limitations of voice recognition software    

## 2022-10-05 NOTE — Progress Notes (Deleted)
Subjective  CC:  Chief Complaint  Patient presents with   Rash    Pt has a rash on her Lt lower back that has been there for the past week. Just itchy with no pain    HPI: Connie Robinson is a 52 y.o. female who presents to the office today to address the problems listed above in the chief complaint. Healthy 52 year old female presents after noticing a red itchy rash on her left back.  Blistering rash.  Has pictures.  No systemic symptoms.  Mild itching.  Has been using over-the-counter hydrocortisone cream.  No pain or nerve like paresthesias.  Has not been vaccinated against shingles.  Has been working outside.  No history of poison ivy in the past.  Rash is not spreading. No history of hypertension but blood pressures are very elevated today.  She did say she was speaking to her daughter prior to this visit and is a little bit stressed.  Otherwise feels fine BP Readings from Last 3 Encounters:  10/05/22 (!) 174/104  07/08/22 118/82  03/11/22 122/82    Assessment  1. Rash and nonspecific skin eruption   2. Elevated blood pressure reading without diagnosis of hypertension      Plan  Rash: Looks like shingles however patient has very little pain or symptoms.  Could also be a contact dermatitis.  Education counseling done.  She is outside the window of needing treatment for shingles.  Will treat like dermatitis with triamcinolone cream twice daily.  Should improve. Patient to monitor blood pressure outside of the office.  She will follow-up with me if remains elevated.  Likely stress-induced  Follow up: As needed if not proving Visit date not found  No orders of the defined types were placed in this encounter.  Meds ordered this encounter  Medications   triamcinolone cream (KENALOG) 0.1 %    Sig: Apply 1 Application topically 2 (two) times daily. For 1-2 weeks    Dispense:  30 g    Refill:  0      I reviewed the patients updated PMH, FH, and SocHx.    Patient Active  Problem List   Diagnosis Date Noted   Nephrolithiasis    Current Meds  Medication Sig   calcium citrate-vitamin D (CITRACAL+D) 315-200 MG-UNIT tablet Take 1 tablet by mouth 2 (two) times daily.   diclofenac (VOLTAREN) 75 MG EC tablet Take 1 tablet (75 mg total) by mouth 2 (two) times daily.   levonorgestrel-ethinyl estradiol (AVIANE) 0.1-20 MG-MCG tablet Take 1 tablet by mouth daily.   Multiple Vitamins-Minerals (MULTIVITAMIN & MINERAL PO) Take by mouth daily.   Omega-3 Fatty Acids (FISH OIL PO) Take by mouth daily.   triamcinolone cream (KENALOG) 0.1 % Apply 1 Application topically 2 (two) times daily. For 1-2 weeks    Allergies: Patient has No Known Allergies. Family History: Patient family history includes Anxiety disorder in her mother; Diabetes in her paternal grandmother; Healthy in her daughter, father, and son; Hyperlipidemia in her paternal grandfather and paternal grandmother; Hypertension in her mother. Social History:  Patient  reports that she has never smoked. She has never been exposed to tobacco smoke. She has never used smokeless tobacco. She reports current alcohol use. She reports that she does not use drugs.  Review of Systems: Constitutional: Negative for fever malaise or anorexia Cardiovascular: negative for chest pain Respiratory: negative for SOB or persistent cough Gastrointestinal: negative for abdominal pain  Objective  Vitals: BP (!) 174/104   Pulse 87  Temp 98.2 F (36.8 C)   Ht  (1.676 m)   Wt 142 lb 12.8 oz (64.8 kg)   SpO2 98%   BMI 23.05 kg/m  General: no acute distress , A&Ox3 HEENT: PEERL, conjunctiva normal, neck is supple Cardiovascular:  RRR without murmur or gallop.  Respiratory:  Good breath sounds bilaterally, CTAB with normal respiratory effort Skin:  Warm, left back with vesicular eruption on erythematous base in dermatomal pattern.  No crusting  Commons side effects, risks, benefits, and alternatives for medications and  treatment plan prescribed today were discussed, and the patient expressed understanding of the given instructions. Patient is instructed to call or message via MyChart if he/she has any questions or concerns regarding our treatment plan. No barriers to understanding were identified. We discussed Red Flag symptoms and signs in detail. Patient expressed understanding regarding what to do in case of urgent or emergency type symptoms.  Medication list was reconciled, printed and provided to the patient in AVS. Patient instructions and summary information was reviewed with the patient as documented in the AVS. This note was prepared with assistance of Dragon voice recognition software. Occasional wrong-word or sound-a-like substitutions may have occurred due to the inherent limitations of voice recognition software

## 2022-10-05 NOTE — Progress Notes (Deleted)
   Subjective  CC:  Chief Complaint  Patient presents with   Rash    Pt has a rash on her Lt lower back that has been there for the past week. Just itchy with no pain    HPI: Connie Robinson is a 52 y.o. female who presents to the office today to address the problems listed above in the chief complaint. ***  Assessment  No diagnosis found.   Plan  ***:  ***  Follow up: *** Visit date not found  No orders of the defined types were placed in this encounter.  No orders of the defined types were placed in this encounter.     I reviewed the patients updated PMH, FH, and SocHx.    Patient Active Problem List   Diagnosis Date Noted   Nephrolithiasis    Current Meds  Medication Sig   calcium citrate-vitamin D (CITRACAL+D) 315-200 MG-UNIT tablet Take 1 tablet by mouth 2 (two) times daily.   diclofenac (VOLTAREN) 75 MG EC tablet Take 1 tablet (75 mg total) by mouth 2 (two) times daily.   levonorgestrel-ethinyl estradiol (AVIANE) 0.1-20 MG-MCG tablet Take 1 tablet by mouth daily.   Multiple Vitamins-Minerals (MULTIVITAMIN & MINERAL PO) Take by mouth daily.   Omega-3 Fatty Acids (FISH OIL PO) Take by mouth daily.    Allergies: Patient has No Known Allergies. Family History: Patient family history includes Anxiety disorder in her mother; Diabetes in her paternal grandmother; Healthy in her daughter, father, and son; Hyperlipidemia in her paternal grandfather and paternal grandmother; Hypertension in her mother. Social History:  Patient  reports that she has never smoked. She has never been exposed to tobacco smoke. She has never used smokeless tobacco. She reports current alcohol use. She reports that she does not use drugs.  Review of Systems: Constitutional: Negative for fever malaise or anorexia Cardiovascular: negative for chest pain Respiratory: negative for SOB or persistent cough Gastrointestinal: negative for abdominal pain  Objective  Vitals: BP (!) 170/88    Pulse 87   Temp 98.2 F (36.8 C)   Ht  (1.676 m)   Wt 142 lb 12.8 oz (64.8 kg)   SpO2 98%   BMI 23.05 kg/m  General: no acute distress , A&Ox3 HEENT: PEERL, conjunctiva normal, neck is supple Cardiovascular:  RRR without murmur or gallop.  Respiratory:  Good breath sounds bilaterally, CTAB with normal respiratory effort Skin:  Warm, no rashes  Commons side effects, risks, benefits, and alternatives for medications and treatment plan prescribed today were discussed, and the patient expressed understanding of the given instructions. Patient is instructed to call or message via MyChart if he/she has any questions or concerns regarding our treatment plan. No barriers to understanding were identified. We discussed Red Flag symptoms and signs in detail. Patient expressed understanding regarding what to do in case of urgent or emergency type symptoms.  Medication list was reconciled, printed and provided to the patient in AVS. Patient instructions and summary information was reviewed with the patient as documented in the AVS. This note was prepared with assistance of Dragon voice recognition software. Occasional wrong-word or sound-a-like substitutions may have occurred due to the inherent limitations of voice recognition software

## 2022-10-05 NOTE — Patient Instructions (Addendum)
Please follow up if symptoms do not improve or as needed.    Poison Ivy Dermatitis (vs Shingles) Poison ivy dermatitis is irritation and swelling (inflammation) of the skin caused by chemicals in the leaves of the poison ivy plant. The skin reaction often involves redness, blisters, and extreme itching. What are the causes? This condition is caused by a chemical (urushiol) found in the sap of the poison ivy plant. This chemical is sticky and can easily spread to people, animals, and objects. You can get poison ivy dermatitis by: Having direct contact with a poison ivy plant. Touching animals, other people, or objects that have come in contact with poison ivy and have the chemical on them. What increases the risk? This condition is more likely to develop in people who: Are outdoors often in wooded or [place] areas. Go outdoors without wearing protective clothing, such as closed shoes, long pants, and a long-sleeved shirt. What are the signs or symptoms? Symptoms of this condition include: Redness of the skin. Extreme itching. A rash that often includes bumps and blisters. The rash usually appears 48 hours after exposure, if you have been exposed before. If this is the first time you have been exposed, the rash may not appear until a week after exposure. Swelling. This may occur if the reaction is more severe. Symptoms usually last for 1-2 weeks. However, the first time you develop this condition, symptoms may last 3-4 weeks. How is this diagnosed? This condition may be diagnosed based on your symptoms and a physical exam. Your health care provider may also ask you about any recent outdoor activity. How is this treated? Treatment for this condition will vary depending on how severe it is. Treatment may include: Hydrocortisone cream or calamine lotion to relieve itching. Oatmeal baths to soothe the skin. Medicines, such as over-the-counter antihistamine tablets. Oral or injected steroid  medicine, for more severe reactions. Follow these instructions at home: Medicines Take or apply over-the-counter and prescription medicines only as told by your health care provider. Use hydrocortisone cream or calamine lotion as needed to soothe the skin and relieve itching. General instructions Do not scratch or rub your skin. Apply a cold, wet cloth (cold compress) to the affected areas or take baths in cool water. This will help with itching. Avoid hot baths and showers. Take oatmeal baths as needed. Use colloidal oatmeal. You can get this at your local pharmacy or grocery store. Follow the instructions on the packaging. Wash all clothes, bedsheets, towels, and blankets you were in contact with between your exposure and appearance of the rash. Check the affected area every day for signs of infection. Check for: More redness, swelling, or pain. Fluid or blood. Warmth. Pus or a bad smell. Keep all follow-up visits. Your health care provider may want to see how your skin is progressing with treatment. How is this prevented?  Learn to identify the poison ivy plant and avoid contact with the plant. This plant can be recognized by the number of leaves. Generally, poison ivy has three leaves with flowering branches on a single stem. The leaves are typically glossy, and they have jagged edges that come to a point. If you have been exposed to poison ivy, thoroughly wash with soap and water right away. You have about 30 minutes to remove the plant resin before it will cause the rash. Be sure to wash under your fingernails, because any plant resin there will continue to spread the rash. When hiking or camping, wear clothes that  will help you to avoid skin exposure. This includes long pants, a long-sleeved shirt, long socks, and hiking boots. You can also apply preventive lotion to your skin to help limit exposure. If you suspect that your clothes or outdoor gear came in contact with poison ivy, rinse  them off outside with a garden hose before you bring them inside your house. When doing yard work or gardening, wear gloves, long sleeves, long pants, and boots. Wash your garden tools and gloves if they come in contact with poison ivy. If you suspect that your pet has come into contact with poison ivy, wash them with pet shampoo and water. Make sure to wear gloves while washing your pet. Contact a health care provider if: You have open sores in the rash area. You have any signs of infection. You have redness that spreads beyond the rash area. You have a fever. You have a rash over a large area of your body. You have a rash on your eyes, mouth, or genitals. You have a rash that does not improve after a few weeks. Get help right away if: Your face swells or your eyes swell shut. You have trouble breathing. You have trouble swallowing. These symptoms may be an emergency. Get help right away. Call 911. Do not wait to see if the symptoms will go away. Do not drive yourself to the hospital. This information is not intended to replace advice given to you by your health care provider. Make sure you discuss any questions you have with your health care provider. Document Revised: 10/30/2021 Document Reviewed: 10/30/2021 Elsevier Patient Education  2023 [employer].

## 2022-10-06 ENCOUNTER — Telehealth: Payer: Self-pay | Admitting: Family Medicine

## 2022-10-06 ENCOUNTER — Encounter: Payer: Self-pay | Admitting: Family Medicine

## 2022-10-06 NOTE — Telephone Encounter (Signed)
Pt came in to the office yesterday and her BP was high. It was high as well today. She would like a call back. Please advise.

## 2022-10-11 IMAGING — MG MM DIGITAL SCREENING BILAT W/ TOMO AND CAD
8 series · 9 of 24 positions shown · non-contrast
Comparison: Previous exam(s).

CLINICAL DATA: Screening.

EXAM:
DIGITAL SCREENING BILATERAL MAMMOGRAM WITH TOMOSYNTHESIS AND CAD
TECHNIQUE: Bilateral screening digital craniocaudal and mediolateral oblique
mammograms were obtained. Bilateral screening digital breast
tomosynthesis was performed. The images were evaluated with
computer-aided detection.

[L CC synth-2D]
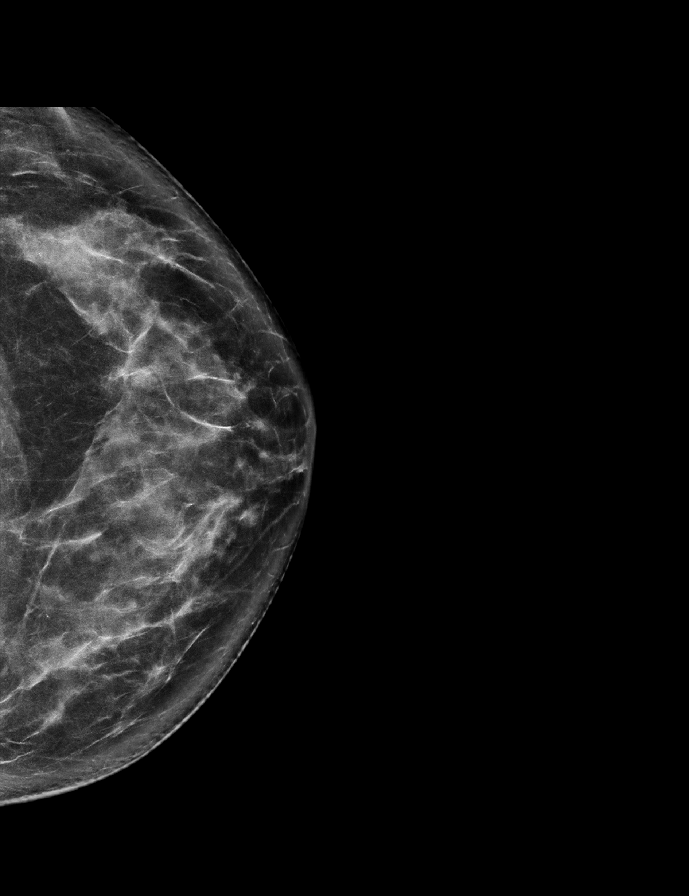

[R CC synth-2D]
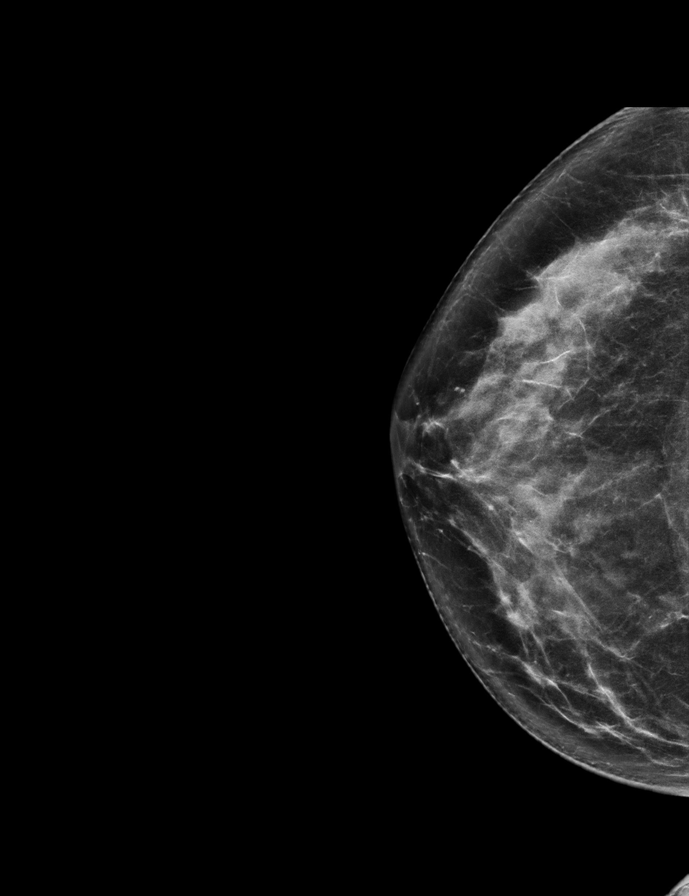

[R MLO synth-2D]
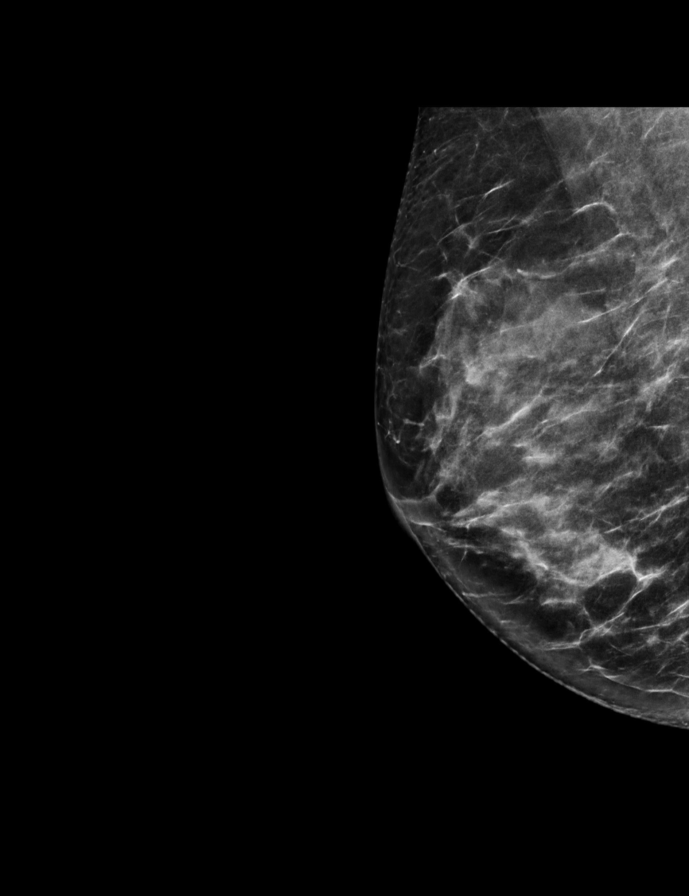

[L MLO synth-2D]
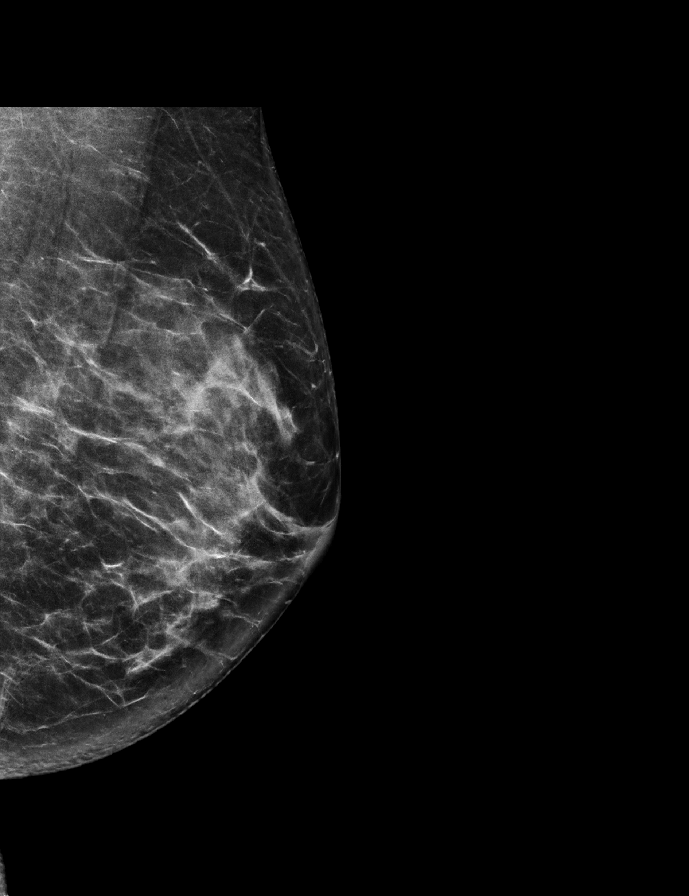

[R CC tomo · 2 of 70 frames shown]
[frame 23/70]
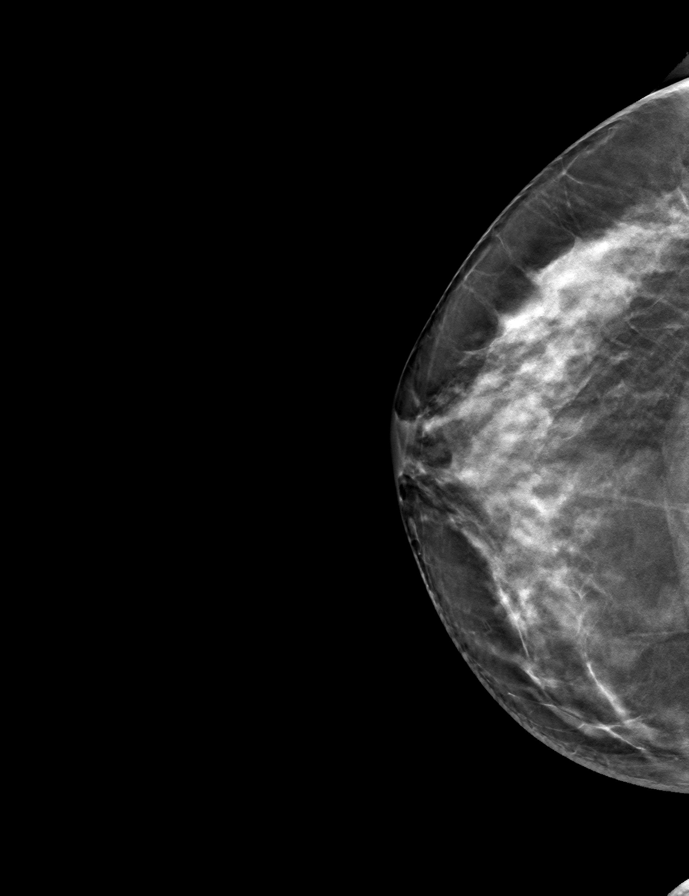
[frame 35/70]
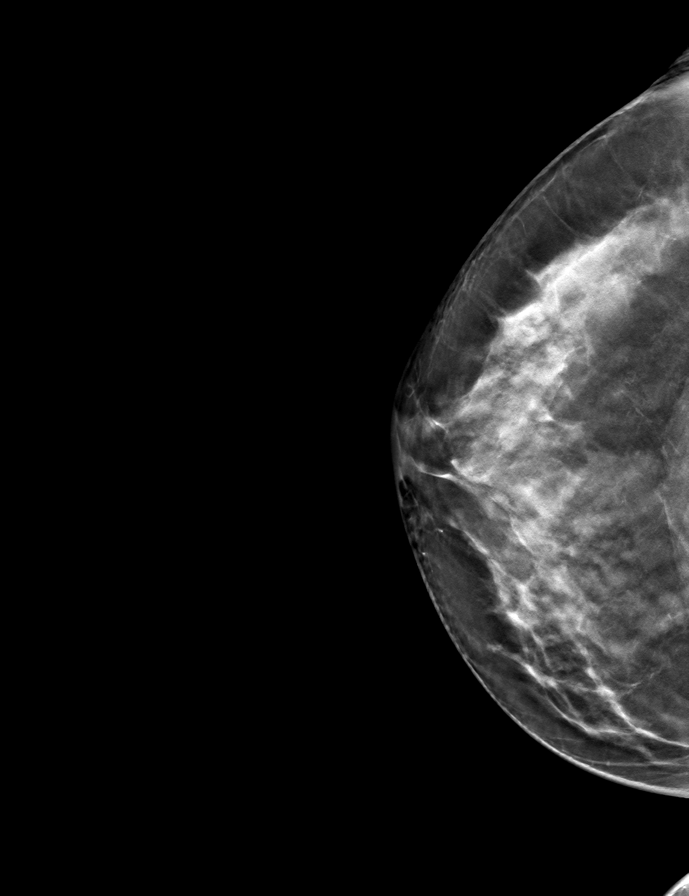

[L MLO tomo · tomo slice 36/71.0]
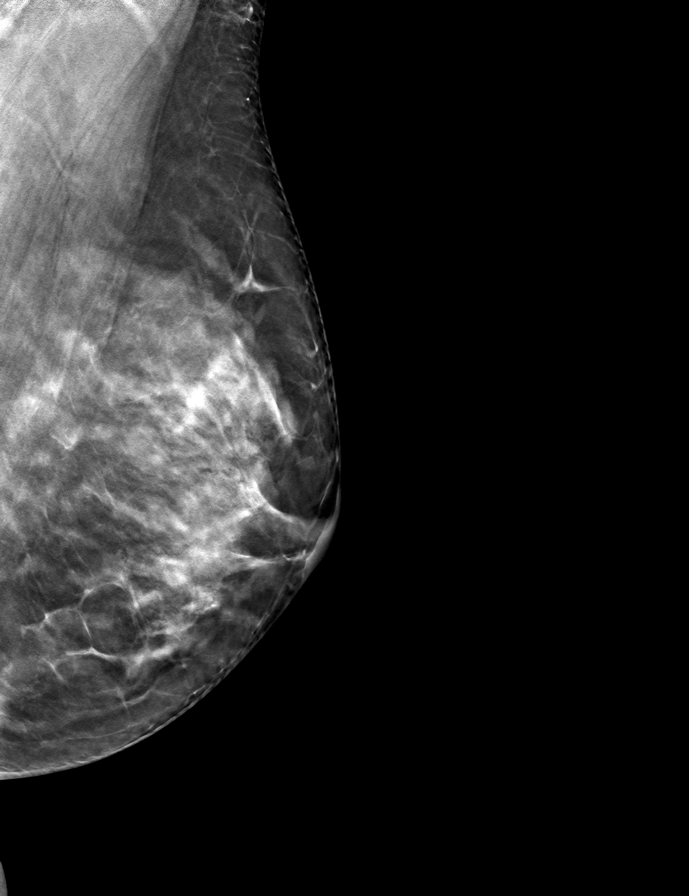

[L CC tomo · tomo slice 39/77.0]
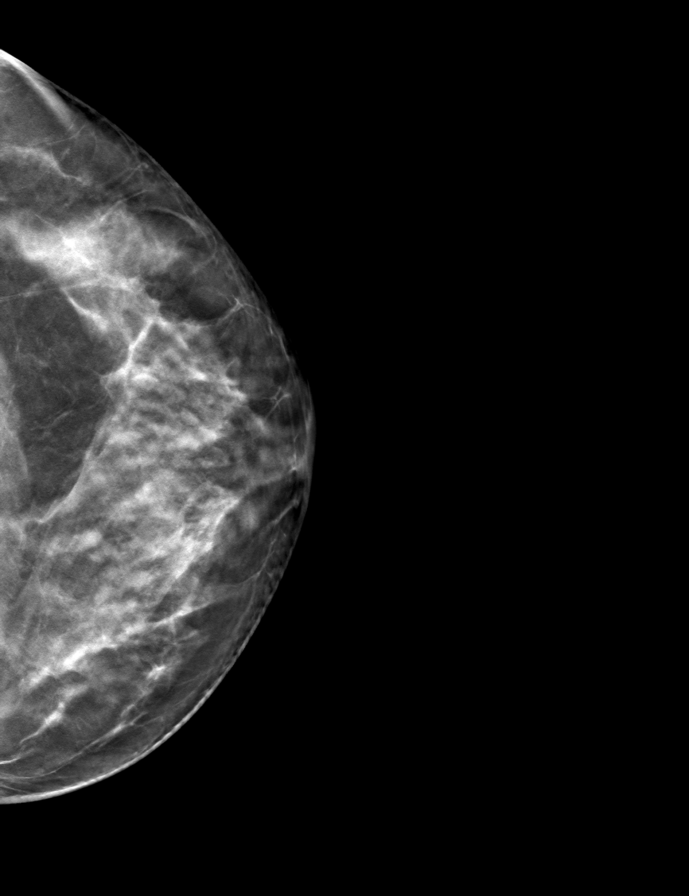

[R MLO tomo · tomo slice 33/66.0]
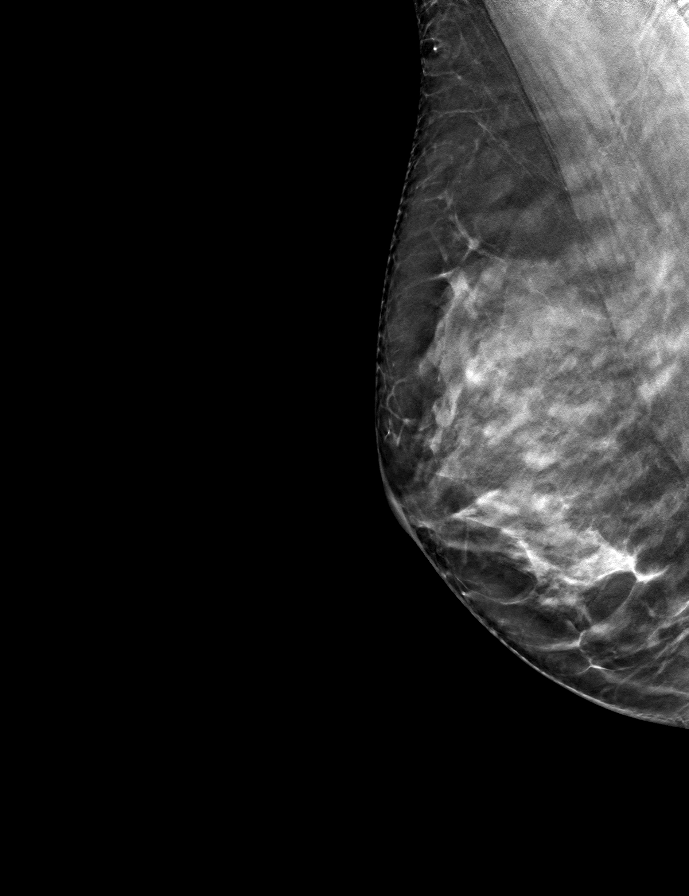

[9 of 24 positions shown; findings below may reference images not displayed]

ACR Breast Density Category c: The breast tissue is heterogeneously
dense, which may obscure small masses.
FINDINGS: There are no findings suspicious for malignancy.
IMPRESSION: No mammographic evidence of malignancy. A result letter of this
screening mammogram will be mailed directly to the patient.

RECOMMENDATION:
Screening mammogram in one year. (Code:Q3-W-BC3)

BI-RADS CATEGORY  1: Negative.

## 2022-10-13 NOTE — Telephone Encounter (Signed)
LVM for pt to return my call and let me know how her Bp has been running.

## 2023-01-12 ENCOUNTER — Other Ambulatory Visit: Payer: Self-pay | Admitting: Family Medicine

## 2023-01-12 DIAGNOSIS — Z0000 Encounter for general adult medical examination without abnormal findings: Secondary | ICD-10-CM

## 2023-02-01 ENCOUNTER — Ambulatory Visit
Admission: RE | Admit: 2023-02-01 | Discharge: 2023-02-01 | Disposition: A | Discharge status: Home / Self Care | Payer: 59 | Source: Ambulatory Visit | Attending: Family Medicine | Admitting: Family Medicine

## 2023-02-01 DIAGNOSIS — Z0000 Encounter for general adult medical examination without abnormal findings: Secondary | ICD-10-CM

## 2023-03-17 ENCOUNTER — Other Ambulatory Visit (HOSPITAL_COMMUNITY)
Admission: RE | Admit: 2023-03-17 | Discharge: 2023-03-17 | Disposition: A | Discharge status: Home / Self Care | Payer: 59 | Source: Ambulatory Visit | Attending: Obstetrics and Gynecology | Admitting: Obstetrics and Gynecology

## 2023-03-17 ENCOUNTER — Encounter: Payer: Self-pay | Admitting: Obstetrics and Gynecology

## 2023-03-17 ENCOUNTER — Ambulatory Visit: Payer: 59 | Admitting: Obstetrics and Gynecology

## 2023-03-17 ENCOUNTER — Ambulatory Visit (INDEPENDENT_AMBULATORY_CARE_PROVIDER_SITE_OTHER): Payer: 59 | Admitting: Obstetrics and Gynecology

## 2023-03-17 VITALS — BP 118/70 | HR 92 | Ht 65.5 in | Wt 145.0 lb

## 2023-03-17 DIAGNOSIS — Z01419 Encounter for gynecological examination (general) (routine) without abnormal findings: Secondary | ICD-10-CM | POA: Diagnosis not present

## 2023-03-17 DIAGNOSIS — Z3041 Encounter for surveillance of contraceptive pills: Secondary | ICD-10-CM

## 2023-03-17 MED ORDER — LEVONORGESTREL-ETHINYL ESTRAD 0.1-20 MG-MCG PO TABS: 1.0000 | ORAL_TABLET | Freq: Every day | ORAL | 3 refills | Status: DC

## 2023-03-17 NOTE — Progress Notes (Signed)
52 y.o. y.o. female here for annual exam.  Patient's last menstrual period was 02/24/2023 (exact date). Period Duration (Days): 2-23 Period Pattern: Regular Menstrual Flow: Light Menstrual Control: Maxi pad Dysmenorrhea: None  Moved here from [place] for [facility] and stayed and got married.  2 kids (one in [place] and other in [place] both in college) Is in a busy time at work and would like to stay on her current ocp's vs. HRT Denies any hot flashes  Health Maintenance: Pap:  02/27/21 WNL Hr Hpv Neg, 11/30/19 WNL Hr HPV Neg. Remote abnormal pap in 20's History of abnormal Pap:  Yes in her 20's She had a colposcopy.  MMG:  8/21/24density C Bi-rads 1 neg  BMD:   n/a Colonoscopy: 08/11/21 normal f/u 10 years  TDaP:  04/09/21 Gardasil: n/a  Blood pressure 118/70, pulse 92, height 5' 5.5" (1.664 m), weight 145 lb (65.8 kg), last menstrual period 02/24/2023, SpO2 99%.     Component Value Date/Time   DIAGPAP  02/27/2021 1137    - Negative for Intraepithelial Lesions or Malignancy (NILM)   DIAGPAP - Benign reactive/reparative changes 02/27/2021 1137   DIAGPAP  11/30/2019 1634    - Negative for intraepithelial lesion or malignancy (NILM)   HPVHIGH Negative 02/27/2021 1137   HPVHIGH Negative 11/30/2019 1634   ADEQPAP  02/27/2021 1137    Satisfactory for evaluation; transformation zone component PRESENT.   ADEQPAP  11/30/2019 1634    Satisfactory for evaluation; transformation zone component PRESENT.   ADEQPAP  11/02/2018 0000    Satisfactory for evaluation  endocervical/transformation zone component PRESENT.    GYN HISTORY:    Component Value Date/Time   DIAGPAP  02/27/2021 1137    - Negative for Intraepithelial Lesions or Malignancy (NILM)   DIAGPAP - Benign reactive/reparative changes 02/27/2021 1137   DIAGPAP  11/30/2019 1634    - Negative for intraepithelial lesion or malignancy (NILM)   HPVHIGH Negative 02/27/2021 1137   HPVHIGH Negative 11/30/2019 1634   ADEQPAP   02/27/2021 1137    Satisfactory for evaluation; transformation zone component PRESENT.   ADEQPAP  11/30/2019 1634    Satisfactory for evaluation; transformation zone component PRESENT.   ADEQPAP  11/02/2018 0000    Satisfactory for evaluation  endocervical/transformation zone component PRESENT.    OB History  Gravida Para Term Preterm AB Living  2 2 2     2   SAB IAB Ectopic Multiple Live Births               # Outcome Date GA Lbr Len/2nd Weight Sex Type Anes PTL Lv  2 Term           1 Term             Obstetric Comments  Vaginal deliveries.    Past Medical History:  Diagnosis Date   Chicken pox    History of BCG vaccination 01/15/1974, 12/12/1982   Mumps    Nephrolithiasis    Shingles     Past Surgical History:  Procedure Laterality Date   COLPOSCOPY  2000    Current Outpatient Medications on File Prior to Visit  Medication Sig Dispense Refill   calcium citrate-vitamin D (CITRACAL+D) 315-200 MG-UNIT tablet Take 1 tablet by mouth 2 (two) times daily.     levonorgestrel-ethinyl estradiol (AVIANE) 0.1-20 MG-MCG tablet Take 1 tablet by mouth daily. 84 tablet 3   Multiple Vitamins-Minerals (MULTIVITAMIN & MINERAL PO) Take by mouth daily.     No current facility-administered medications on file prior  to visit.    Social History   Socioeconomic History   Marital status: Married    Spouse name: Not on file   Number of children: 2   Years of education: 18   Highest education level: Not on file  Occupational History   Occupation: [employer]   Tobacco Use   Smoking status: Never    Passive exposure: Never   Smokeless tobacco: Never  Vaping Use   Vaping status: Never Used  Substance and Sexual Activity   Alcohol use: Not Currently   Drug use: No   Sexual activity: Yes    Partners: Male    Birth control/protection: OCP  Other Topics Concern   Not on file  Social History Narrative   Fun: Ski, work out, travel   Denies religious beliefs effecting health care.     Denies abuse and feels safe at home.    Social Determinants of Health   Financial Resource Strain: Not on file  Food Insecurity: Not on file  Transportation Needs: Not on file  Physical Activity: Not on file  Stress: Not on file  Social Connections: Unknown (10/28/2021)   [facility]   Social Network    Social Network: Not on file  Intimate Partner Violence: Unknown (09/19/2021)   Received from [facility]   HITS    Physically Hurt: Not on file    Insult or Talk Down To: Not on file    Threaten Physical Harm: Not on file    Scream or Curse: Not on file    Family History  Problem Relation Age of Onset   Hypertension Mother    Anxiety disorder Mother    Healthy Father    Hyperlipidemia Paternal Grandmother    Diabetes Paternal Grandmother    Hyperlipidemia Paternal Grandfather    Healthy Daughter    Healthy Son    Rectal cancer Neg Hx    Stomach cancer Neg Hx      No Known Allergies    Patient's last menstrual period was Patient's last menstrual period was 02/24/2023 (exact date)..             Review of Systems Alls systems reviewed and are negative.     Physical Exam    A:         Well Woman GYN exam                             P:        Pap smear collected.             Encouraged annual mammogram screening             Refill on ocp's encouraged hormone panel and then transition to HRT next year             Labs and immunizations with her primary             Discussed breast self exams      Earley Favor

## 2023-03-22 LAB — CYTOLOGY - PAP
Comment: NEGATIVE
Diagnosis: NEGATIVE
High risk HPV: NEGATIVE

## 2023-07-14 ENCOUNTER — Ambulatory Visit (INDEPENDENT_AMBULATORY_CARE_PROVIDER_SITE_OTHER): Payer: 59 | Admitting: Family Medicine

## 2023-07-14 ENCOUNTER — Encounter: Payer: Self-pay | Admitting: Family Medicine

## 2023-07-14 VITALS — BP 135/94 | HR 83 | Temp 97.7°F | Ht 65.5 in | Wt 143.8 lb

## 2023-07-14 DIAGNOSIS — Z3041 Encounter for surveillance of contraceptive pills: Secondary | ICD-10-CM

## 2023-07-14 DIAGNOSIS — Z23 Encounter for immunization: Secondary | ICD-10-CM | POA: Diagnosis not present

## 2023-07-14 DIAGNOSIS — R030 Elevated blood-pressure reading, without diagnosis of hypertension: Secondary | ICD-10-CM | POA: Diagnosis not present

## 2023-07-14 DIAGNOSIS — Z0000 Encounter for general adult medical examination without abnormal findings: Secondary | ICD-10-CM | POA: Diagnosis not present

## 2023-07-14 LAB — COMPREHENSIVE METABOLIC PANEL
ALT: 18 U/L (ref 0–35)
AST: 16 U/L (ref 0–37)
Albumin: 4.5 g/dL (ref 3.5–5.2)
Alkaline Phosphatase: 33 U/L — ABNORMAL LOW (ref 39–117)
BUN: 10 mg/dL (ref 6–23)
CO2: 28 meq/L (ref 19–32)
Calcium: 9.3 mg/dL (ref 8.4–10.5)
Chloride: 102 meq/L (ref 96–112)
Creatinine, Ser: 0.77 mg/dL (ref 0.40–1.20)
GFR: 89.26 mL/min (ref >60.00)
Glucose, Bld: 89 mg/dL (ref 70–99)
Potassium: 4.3 meq/L (ref 3.5–5.1)
Sodium: 137 meq/L (ref 135–145)
Total Bilirubin: 0.5 mg/dL (ref 0.2–1.2)
Total Protein: 7.1 g/dL (ref 6.0–8.3)

## 2023-07-14 LAB — LIPID PANEL
Cholesterol: 188 mg/dL (ref 0–200)
HDL: 46.7 mg/dL (ref >39.00)
LDL Cholesterol: 119 mg/dL — ABNORMAL HIGH (ref 0–99)
NonHDL: 141.61
Total CHOL/HDL Ratio: 4
Triglycerides: 113 mg/dL (ref 0.0–149.0)
VLDL: 22.6 mg/dL (ref 0.0–40.0)

## 2023-07-14 LAB — CBC WITH DIFFERENTIAL/PLATELET
Basophils Absolute: 0.1 10*3/uL (ref 0.0–0.1)
Basophils Relative: 1.1 % (ref 0.0–3.0)
Eosinophils Absolute: 0 10*3/uL (ref 0.0–0.7)
Eosinophils Relative: 0.7 % (ref 0.0–5.0)
HCT: 48 % — ABNORMAL HIGH (ref 36.0–46.0)
Hemoglobin: 16 g/dL — ABNORMAL HIGH (ref 12.0–15.0)
Lymphocytes Relative: 33.6 % (ref 12.0–46.0)
Lymphs Abs: 2.1 10*3/uL (ref 0.7–4.0)
MCHC: 33.4 g/dL (ref 30.0–36.0)
MCV: 95.9 fL (ref 78.0–100.0)
Monocytes Absolute: 0.4 10*3/uL (ref 0.1–1.0)
Monocytes Relative: 6.1 % (ref 3.0–12.0)
Neutro Abs: 3.6 10*3/uL (ref 1.4–7.7)
Neutrophils Relative %: 58.5 % (ref 43.0–77.0)
Platelets: 337 10*3/uL (ref 150.0–400.0)
RBC: 5.01 Mil/uL (ref 3.87–5.11)
RDW: 12.8 % (ref 11.5–15.5)
WBC: 6.1 10*3/uL (ref 4.0–10.5)

## 2023-07-14 LAB — FOLLICLE STIMULATING HORMONE: FSH: 14.5 m[IU]/mL

## 2023-07-14 NOTE — Progress Notes (Signed)
Subjective  Chief Complaint  Patient presents with   Annual Exam    HPI: Connie Robinson is a 53 y.o. female who presents to [facility] [place] today for a Female Wellness Visit.   Wellness Visit: annual visit with health maintenance review and exam  HM: all screens are current. Eligible for shingrix vaccination. Healthy lifestyle  OCP use: now 53 yo. Will need /wants to transition to HRT. On placebo week of pill pak now. Light menses. Feels great.  Elevated bp: likely white coat component. Normotensive at recent gyn visit and at dentist. Last year same here: home readings confirmed normal.   Assessment  1. Annual physical exam   2. Need for shingles vaccine   3. Oral contraceptive use   4. Elevated blood pressure reading without diagnosis of hypertension      Plan  Female Wellness Visit: Age appropriate Health Maintenance and Prevention measures were discussed with patient. Included topics are cancer screening recommendations, ways to keep healthy (see AVS) including dietary and exercise recommendations, regular eye and dental care, use of seat belts, and avoidance of moderate alcohol use and tobacco use. Screens are current BMI: discussed patient's BMI and encouraged positive lifestyle modifications to help get to or maintain a target BMI. HM needs and immunizations were addressed and ordered. See below for orders. See HM and immunization section for updates. Shingrix today #1, return in 6 months for 2nd. Routine labs and screening tests ordered including cmp, cbc and lipids where appropriate. Discussed recommendations regarding Vit D and calcium supplementation (see AVS) Check fsh: transition to hrt after confirmation if elevated. If nl, will continue OCPs x 1 year more. Pt understands and agrees with careplane  Follow up: 12 mo for cpe (74mo for 2nd shingrix)  Orders Placed This Encounter  Procedures   Zoster Recombinant (Shingrix )   Lipid panel    Comprehensive metabolic panel   CBC with Differential/Platelet   FSH   No orders of the defined types were placed in this encounter.     Body mass index is 23.57 kg/m. Wt Readings from Last 3 Encounters:  07/14/23 143 lb 12.8 oz (65.2 kg)  03/17/23 145 lb (65.8 kg)  10/05/22 142 lb 12.8 oz (64.8 kg)     Patient Active Problem List   Diagnosis Date Noted Date Diagnosed   Nephrolithiasis     Health Maintenance  Topic Date Due   INFLUENZA VACCINE  09/13/2023 (Originally 01/14/2023)   Zoster Vaccines- Shingrix (2 of 2) 09/08/2023   MAMMOGRAM  02/01/2024   Cervical Cancer Screening (HPV/Pap Cotest)  03/16/2028   DTaP/Tdap/Td (8 - Td or Tdap) 04/10/2031   Colonoscopy  08/12/2031   Hepatitis C Screening  Completed   HIV Screening  Completed   HPV VACCINES  Aged Out   COVID-19 Vaccine  Discontinued   Immunization History  Administered Date(s) Administered   DTaP 08/20/1972, 07/03/1973, 01/15/1978, 12/12/1982, 12/24/2009   Hepatitis A 03/15/1996, 03/15/1998   Hepatitis B 03/15/1996, 05/15/1996, 11/13/1996   IPV 01/20/1978, 12/12/1982, 12/28/1988, 12/05/1993   Influenza,inj,Quad PF,6+ Mos 03/16/2020, 04/09/2021, 07/08/2022   Janssen (J&J) SARS-COV-2 Vaccination 09/12/2019   MMR 04/18/1973   Moderna Covid-19 Vaccine Bivalent Booster 70yrs & up 02/21/2021   Moderna Sars-Covid-2 Vaccination 04/19/2020   Td 03/15/1996   Tdap 04/09/2021   Varicella 09/02/1973   Zoster Recombinant(Shingrix) 07/14/2023   We updated and reviewed the patient's past history in detail and it is documented below. Allergies: Patient has no known allergies. Past Medical  History Patient  has a past medical history of Chicken pox, History of BCG vaccination (01/15/1974, 12/12/1982), Mumps, Nephrolithiasis, and Shingles. Past Surgical History Patient  has a past surgical history that includes Colposcopy (2000). Family History: Patient family history includes Anxiety disorder in her mother; Diabetes in  her paternal grandmother; Healthy in her daughter, father, and son; Hyperlipidemia in her paternal grandfather and paternal grandmother; Hypertension in her mother. Social History:  Patient  reports that she has never smoked. She has never been exposed to tobacco smoke. She has never used smokeless tobacco. She reports that she does not currently use alcohol. She reports that she does not use drugs.  Review of Systems: Constitutional: negative for fever or malaise Ophthalmic: negative for photophobia, double vision or loss of vision Cardiovascular: negative for chest pain, dyspnea on exertion, or new LE swelling Respiratory: negative for SOB or persistent cough Gastrointestinal: negative for abdominal pain, change in bowel habits or melena Genitourinary: negative for dysuria or gross hematuria, no abnormal uterine bleeding or disharge Musculoskeletal: negative for new gait disturbance or muscular weakness Integumentary: negative for new or persistent rashes, no breast lumps Neurological: negative for TIA or stroke symptoms Psychiatric: negative for SI or delusions Allergic/Immunologic: negative for hives  Patient Care Team    Relationship Specialty Notifications Start End  Willow Ora, MD PCP - General Family Medicine  12/06/19   Napoleon Form, MD Consulting Physician Gastroenterology  07/08/22   Earley Favor, MD Consulting Physician Obstetrics and Gynecology  07/14/23     Objective  Vitals: BP (!) 135/94   Pulse 83   Temp 97.7 F (36.5 C)   Ht 5' 5.5" (1.664 m)   Wt 143 lb 12.8 oz (65.2 kg)   SpO2 100%   BMI 23.57 kg/m  General:  Well developed, well nourished, no acute distress  Psych:  Alert and orientedx3,normal mood and affect HEENT:  Normocephalic, atraumatic, non-icteric sclera,  supple neck without adenopathy, mass or thyromegaly Cardiovascular:  Normal S1, S2, RRR without gallop, rub or murmur Respiratory:  Good breath sounds bilaterally, CTAB with normal  respiratory effort Gastrointestinal: normal bowel sounds, soft, non-tender, no noted masses. No HSM MSK: extremities without edema, joints without erythema or swelling  Commons side effects, risks, benefits, and alternatives for medications and treatment plan prescribed today were discussed, and the patient expressed understanding of the given instructions. Patient is instructed to call or message via MyChart if he/she has any questions or concerns regarding our treatment plan. No barriers to understanding were identified. We discussed Red Flag symptoms and signs in detail. Patient expressed understanding regarding what to do in case of urgent or emergency type symptoms.  Medication list was reconciled, printed and provided to the patient in AVS. Patient instructions and summary information was reviewed with the patient as documented in the AVS. This note was prepared with assistance of Dragon voice recognition software. Occasional wrong-word or sound-a-like substitutions may have occurred due to the inherent limitations of voice recognition software

## 2023-07-14 NOTE — Patient Instructions (Signed)
Please return in 12 months for your annual complete physical; please come fasting.   I will release your lab results to you on your MyChart account with further instructions. You may see the results before I do, but when I review them I will send you a message with my report or have my assistant call you if things need to be discussed. Please reply to my message with any questions. Thank you!   If you have any questions or concerns, please don't hesitate to send me a message via MyChart or call the office at [phone]. Thank you for visiting with Connie Robinson today! It's our pleasure caring for you.   Preventive Care 66-52 Years Old, Female Preventive care refers to lifestyle choices and visits with your health care provider that can promote health and wellness. Preventive care visits are also called wellness exams. What can I expect for my preventive care visit? Counseling Your health care provider may ask you questions about your: Medical history, including: Past medical problems. Family medical history. Pregnancy history. Current health, including: Menstrual cycle. Method of birth control. Emotional well-being. Home life and relationship well-being. Sexual activity and sexual health. Lifestyle, including: Alcohol, nicotine or tobacco, and drug use. Access to firearms. Diet, exercise, and sleep habits. Work and work Astronomer. Sunscreen use. Safety issues such as seatbelt and bike helmet use. Physical exam Your health care provider will check your: Height and weight. These may be used to calculate your BMI (body mass index). BMI is a measurement that tells if you are at a healthy weight. Waist circumference. This measures the distance around your waistline. This measurement also tells if you are at a healthy weight and may help predict your risk of certain diseases, such as type 2 diabetes and high blood pressure. Heart rate and blood pressure. Body temperature. Skin for abnormal  spots. What immunizations do I need?  Vaccines are usually given at various ages, according to a schedule. Your health care provider will recommend vaccines for you based on your age, medical history, and lifestyle or other factors, such as travel or where you work. What tests do I need? Screening Your health care provider may recommend screening tests for certain conditions. This may include: Lipid and cholesterol levels. Diabetes screening. This is done by checking your blood sugar (glucose) after you have not eaten for a while (fasting). Pelvic exam and Pap test. Hepatitis B test. Hepatitis C test. HIV (human immunodeficiency virus) test. STI (sexually transmitted infection) testing, if you are at risk. Lung cancer screening. Colorectal cancer screening. Mammogram. Talk with your health care provider about when you should start having regular mammograms. This may depend on whether you have a family history of breast cancer. BRCA-related cancer screening. This may be done if you have a family history of breast, ovarian, tubal, or peritoneal cancers. Bone density scan. This is done to screen for osteoporosis. Talk with your health care provider about your test results, treatment options, and if necessary, the need for more tests. Follow these instructions at home: Eating and drinking  Eat a diet that includes fresh fruits and vegetables, whole grains, lean protein, and low-fat dairy products. Take vitamin and mineral supplements as recommended by your health care provider. Do not drink alcohol if: Your health care provider tells you not to drink. You are pregnant, may be pregnant, or are planning to become pregnant. If you drink alcohol: Limit how much you have to 0-1 drink a day. Know how much alcohol is in your  drink. In the [place] one drink equals one 12 oz bottle of beer (355 mL), one 5 oz glass of wine (148 mL), or one 1 oz glass of hard liquor (44 mL). Lifestyle Brush your  teeth every morning and night with fluoride toothpaste. Floss one time each day. Exercise for at least 30 minutes 5 or more days each week. Do not use any products that contain nicotine or tobacco. These products include cigarettes, chewing tobacco, and vaping devices, such as e-cigarettes. If you need help quitting, ask your health care provider. Do not use drugs. If you are sexually active, practice safe sex. Use a condom or other form of protection to prevent STIs. If you do not wish to become pregnant, use a form of birth control. If you plan to become pregnant, see your health care provider for a prepregnancy visit. Take aspirin only as told by your health care provider. Make sure that you understand how much to take and what form to take. Work with your health care provider to find out whether it is safe and beneficial for you to take aspirin daily. Find healthy ways to manage stress, such as: Meditation, yoga, or listening to music. Journaling. Talking to a trusted person. Spending time with friends and family. Minimize exposure to UV radiation to reduce your risk of skin cancer. Safety Always wear your seat belt while driving or riding in a vehicle. Do not drive: If you have been drinking alcohol. Do not ride with someone who has been drinking. When you are tired or distracted. While texting. If you have been using any mind-altering substances or drugs. Wear a helmet and other protective equipment during sports activities. If you have firearms in your house, make sure you follow all gun safety procedures. Seek help if you have been physically or sexually abused. What's next? Visit your health care provider once a year for an annual wellness visit. Ask your health care provider how often you should have your eyes and teeth checked. Stay up to date on all vaccines. This information is not intended to replace advice given to you by your health care provider. Make sure you discuss any  questions you have with your health care provider. Document Revised: 11/27/2020 Document Reviewed: 11/27/2020 Elsevier Patient Education  2024 [employer].

## 2023-07-15 ENCOUNTER — Encounter: Payer: Self-pay | Admitting: Family Medicine

## 2023-07-15 NOTE — Progress Notes (Signed)
See mychart note The 10-year ASCVD risk score (Arnett DK, et al., 2019) is: 1.7%   Values used to calculate the score:     Age: 53 years     Sex: Female     Is Non-Hispanic African American: No     Diabetic: No     Tobacco smoker: No     Systolic Blood Pressure: 135 mmHg     Is BP treated: No     HDL Cholesterol: 46.7 mg/dL     Total Cholesterol: 188 mg/dL

## 2023-07-26 ENCOUNTER — Encounter: Payer: Self-pay | Admitting: Obstetrics and Gynecology

## 2023-07-26 DIAGNOSIS — Z3041 Encounter for surveillance of contraceptive pills: Secondary | ICD-10-CM

## 2023-07-26 MED ORDER — LEVONORGESTREL-ETHINYL ESTRAD 0.1-20 MG-MCG PO TABS: 1.0000 | ORAL_TABLET | Freq: Every day | ORAL | 2 refills | Status: DC

## 2023-07-26 NOTE — Telephone Encounter (Signed)
 Per review of EMR, do not see an electronic rx pend. So, will pend to this encounter:  Last AEX 03/17/2023-EB, scheduled for 03/17/2024 Last mammo- 02/01/2023-WNL  Rx pend. (Location of pt's preferred pharmacy added).

## 2023-08-06 ENCOUNTER — Telehealth: Payer: Self-pay | Admitting: Family Medicine

## 2023-08-06 NOTE — Telephone Encounter (Signed)
Patient dropped off document wellness form , to be filled out by provider. Patient requested to send it back via Fax [phone] within 5-days. Document is located in providers tray at front office.Please advise at Mobile [phone] (mobile)

## 2023-12-31 ENCOUNTER — Other Ambulatory Visit: Payer: Self-pay | Admitting: Family Medicine

## 2023-12-31 DIAGNOSIS — Z1231 Encounter for screening mammogram for malignant neoplasm of breast: Secondary | ICD-10-CM

## 2024-01-06 ENCOUNTER — Ambulatory Visit (INDEPENDENT_AMBULATORY_CARE_PROVIDER_SITE_OTHER): Payer: 59 | Admitting: *Deleted

## 2024-01-06 DIAGNOSIS — Z23 Encounter for immunization: Secondary | ICD-10-CM | POA: Diagnosis not present

## 2024-01-06 NOTE — Progress Notes (Signed)
 Per orders of Corean Comment, NP, injection of Shingrix  0.5 ml  given IM by Arland Chute, LPN in left deltoid. Patient tolerated injection well. She has completed the series.

## 2024-01-12 ENCOUNTER — Ambulatory Visit: Payer: 59

## 2024-03-08 ENCOUNTER — Ambulatory Visit
Admission: RE | Admit: 2024-03-08 | Discharge: 2024-03-08 | Disposition: A | Discharge status: Home / Self Care | Source: Ambulatory Visit | Attending: Family Medicine | Admitting: Family Medicine

## 2024-03-08 DIAGNOSIS — Z1231 Encounter for screening mammogram for malignant neoplasm of breast: Secondary | ICD-10-CM

## 2024-03-09 ENCOUNTER — Ambulatory Visit: Payer: Self-pay | Admitting: Obstetrics and Gynecology

## 2024-03-17 ENCOUNTER — Ambulatory Visit: Payer: 59 | Admitting: Obstetrics and Gynecology

## 2024-04-12 ENCOUNTER — Encounter: Payer: Self-pay | Admitting: Obstetrics and Gynecology

## 2024-04-12 ENCOUNTER — Ambulatory Visit: Admitting: Obstetrics and Gynecology

## 2024-04-12 ENCOUNTER — Other Ambulatory Visit (HOSPITAL_COMMUNITY)
Admission: RE | Admit: 2024-04-12 | Discharge: 2024-04-12 | Disposition: A | Discharge status: Home / Self Care | Source: Ambulatory Visit | Attending: Obstetrics and Gynecology | Admitting: Obstetrics and Gynecology

## 2024-04-12 VITALS — BP 112/78 | HR 85 | Ht 65.25 in | Wt 149.0 lb

## 2024-04-12 DIAGNOSIS — Z1331 Encounter for screening for depression: Secondary | ICD-10-CM

## 2024-04-12 DIAGNOSIS — N951 Menopausal and female climacteric states: Secondary | ICD-10-CM

## 2024-04-12 DIAGNOSIS — Z01419 Encounter for gynecological examination (general) (routine) without abnormal findings: Secondary | ICD-10-CM | POA: Diagnosis not present

## 2024-04-12 NOTE — Progress Notes (Signed)
 53 y.o. y.o. female here for annual exam. Patient's last menstrual period was 03/22/2024 (approximate). Period Duration (Days): 2 Menstrual Control: Maxi pad, Panty liner Dysmenorrhea: None   Moved here from [place] for [facility] and stayed and got married.  2 kids (one in [place]  and other in [place] both in college) Is in a busy time at work and would like to stay on her current ocp's vs. HRT Denies any hot flashes 1/25 [facility] 14 Health Maintenance: Pap:  02/27/21 WNL Hr Hpv Neg, 11/30/19 WNL Hr HPV Neg. Remote abnormal pap in 20's History of abnormal Pap:  Yes in her 20's She had a colposcopy.  MMG:  2025 BMD:   n/a Colonoscopy: 08/11/21 normal f/u 10 years  TDaP:  04/09/21 Gardasil: n/a On ocp's does have some perimenopausal symptoms like brain fog, weight gain. Would like to transition to HRT when she knows she is in menopause  Body mass index is 24.61 kg/m.     04/12/2024    3:59 PM 07/14/2023    8:39 AM 10/05/2022    4:12 PM  Depression screen PHQ 2/9  Decreased Interest 0 0 0  Down, Depressed, Hopeless 0 0 0  PHQ - 2 Score 0 0 0    Blood pressure 112/78, pulse 85, height 5' 5.25 (1.657 m), weight 149 lb (67.6 kg), last menstrual period 03/22/2024, SpO2 98%.     Component Value Date/Time   DIAGPAP  03/17/2023 0837    - Negative for intraepithelial lesion or malignancy (NILM)   DIAGPAP  02/27/2021 1137    - Negative for Intraepithelial Lesions or Malignancy (NILM)   DIAGPAP - Benign reactive/reparative changes 02/27/2021 1137   HPVHIGH Negative 03/17/2023 0837   HPVHIGH Negative 02/27/2021 1137   HPVHIGH Negative 11/30/2019 1634   ADEQPAP  03/17/2023 0837    Satisfactory for evaluation; transformation zone component PRESENT.   ADEQPAP  02/27/2021 1137    Satisfactory for evaluation; transformation zone component PRESENT.   ADEQPAP  11/30/2019 1634    Satisfactory for evaluation; transformation zone component PRESENT.    GYN HISTORY:    Component Value  Date/Time   DIAGPAP  03/17/2023 0837    - Negative for intraepithelial lesion or malignancy (NILM)   DIAGPAP  02/27/2021 1137    - Negative for Intraepithelial Lesions or Malignancy (NILM)   DIAGPAP - Benign reactive/reparative changes 02/27/2021 1137   HPVHIGH Negative 03/17/2023 0837   HPVHIGH Negative 02/27/2021 1137   HPVHIGH Negative 11/30/2019 1634   ADEQPAP  03/17/2023 0837    Satisfactory for evaluation; transformation zone component PRESENT.   ADEQPAP  02/27/2021 1137    Satisfactory for evaluation; transformation zone component PRESENT.   ADEQPAP  11/30/2019 1634    Satisfactory for evaluation; transformation zone component PRESENT.    OB History  Gravida Para Term Preterm AB Living  2 2 2   2   SAB IAB Ectopic Multiple Live Births      2    # Outcome Date GA Lbr Len/2nd Weight Sex Type Anes PTL Lv  2 Term      Vag-Spont   LIV  1 Term      Vag-Spont   LIV    Obstetric Comments  Vaginal deliveries.    Past Medical History:  Diagnosis Date   Chicken pox    History of BCG vaccination 01/15/1974, 12/12/1982   Mumps    Nephrolithiasis    Shingles     Past Surgical History:  Procedure Laterality Date   COLPOSCOPY  2000    Current Outpatient Medications on File Prior to Visit  Medication Sig Dispense Refill   calcium citrate-vitamin D (CITRACAL+D) 315-200 MG-UNIT tablet Take 1 tablet by mouth 2 (two) times daily.     levonorgestrel -ethinyl estradiol (AVIANE) 0.1-20 MG-MCG tablet Take 1 tablet by mouth daily. 84 tablet 2   Multiple Vitamins-Minerals (MULTIVITAMIN & MINERAL PO) Take by mouth daily.     No current facility-administered medications on file prior to visit.    Social History   Socioeconomic History   Marital status: Married    Spouse name: Not on file   Number of children: 2   Years of education: 18   Highest education level: Not on file  Occupational History   Occupation: [employer]   Tobacco Use   Smoking status: Never    Passive  exposure: Never   Smokeless tobacco: Never  Vaping Use   Vaping status: Never Used  Substance and Sexual Activity   Alcohol use: Not Currently   Drug use: No   Sexual activity: Yes    Partners: Male    Birth control/protection: OCP  Other Topics Concern   Not on file  Social History Narrative   Moved here from [place] for [employer] and stayed and got married.  Works in education officer, environmental. 2 kids (one in [place]  and other in [place] )   Fun: Ski, work out, travel   Denies religious beliefs effecting health care.    Denies abuse and feels safe at home.    Social Drivers of Corporate Investment Banker Strain: Not on file  Food Insecurity: Not on file  Transportation Needs: Not on file  Physical Activity: Not on file  Stress: Not on file  Social Connections: Unknown (10/28/2021)   Received from [facility]   Social Network    Social Network: Not on file  Intimate Partner Violence: Unknown (09/19/2021)   Received from [facility]   HITS    Physically Hurt: Not on file    Insult or Talk Down To: Not on file    Threaten Physical Harm: Not on file    Scream or Curse: Not on file    Family History  Problem Relation Age of Onset   Hypertension Mother    Anxiety disorder Mother    Healthy Father    Healthy Daughter    Hyperlipidemia Paternal Grandmother    Diabetes Paternal Grandmother    Hyperlipidemia Paternal Grandfather    Healthy Son    Rectal cancer Neg Hx    Stomach cancer Neg Hx    Breast cancer Neg Hx      No Known Allergies    Patient's last menstrual period was Patient's last menstrual period was 03/22/2024 (approximate)..            Review of Systems Alls systems reviewed and are negative.     OBGyn Exam    A:         Well Woman GYN exam                             P:        Pap smear collected today Encouraged annual mammogram screening Colon cancer screening up-to-date DXA not indicated Labs and immunizations to do with PMD Discussed breast self  exams Encouraged healthy lifestyle practices Encouraged Vit D and Calcium  RTC for hormone panel off ocp's for 2 weeks. To transition to HRT when [facility] is at 40 or  higher.   No follow-ups on file.  Connie Robinson

## 2024-04-12 NOTE — Patient Instructions (Signed)
 Perimenopause/Menopause suggestions   You should be getting 1,200 mg of calcium a day between your diet and supplements and at least 3000 IU a day of Vit D. You should exercise regularly with weight bearing exercises. I would recommend a bone density q 2 years starting about age 53 for a baseline and based on risk factors.  We loose 5% per year in this time period of our bone density, due to the loss of estrogen. Magnesium at bedtime for our sleep and bones (follow recommended dose on bottle)  Please read The New Menopause my Dr. Ronal Stagger Haver  Try to avoid caffeine and alcohol in this period, as they can make symptoms worse  Continue annual mammograms, unless told sooner  Please reach out with any questions  I have standing orders for [facility] and estradiol 2 weeks off your ocp's Almarie MARLA Carpen

## 2024-04-17 ENCOUNTER — Ambulatory Visit: Payer: Self-pay | Admitting: Obstetrics and Gynecology

## 2024-04-17 LAB — CYTOLOGY - PAP: Diagnosis: NEGATIVE

## 2024-04-28 ENCOUNTER — Other Ambulatory Visit

## 2024-04-28 DIAGNOSIS — N951 Menopausal and female climacteric states: Secondary | ICD-10-CM

## 2024-04-29 LAB — ESTRADIOL: Estradiol: <30 pg/mL

## 2024-04-29 LAB — FOLLICLE STIMULATING HORMONE: FSH: 54 m[IU]/mL

## 2024-05-01 ENCOUNTER — Other Ambulatory Visit: Payer: Self-pay | Admitting: Obstetrics and Gynecology

## 2024-05-01 MED ORDER — ESTRADIOL-NORETHINDRONE ACET 1-0.5 MG PO TABS
1.0000 | ORAL_TABLET | Freq: Every day | ORAL | 6 refills | Status: AC
Start: 2024-05-01 — End: ?

## 2024-07-14 ENCOUNTER — Ambulatory Visit (INDEPENDENT_AMBULATORY_CARE_PROVIDER_SITE_OTHER): Payer: 59 | Admitting: Family Medicine

## 2024-07-14 ENCOUNTER — Encounter: Payer: Self-pay | Admitting: Family Medicine

## 2024-07-14 VITALS — BP 120/78 | HR 81 | Temp 97.7°F | Ht 65.25 in | Wt 147.6 lb

## 2024-07-14 DIAGNOSIS — Z0000 Encounter for general adult medical examination without abnormal findings: Secondary | ICD-10-CM | POA: Diagnosis not present

## 2024-07-14 DIAGNOSIS — Z79890 Hormone replacement therapy (postmenopausal): Secondary | ICD-10-CM | POA: Insufficient documentation

## 2024-07-14 LAB — CBC WITH DIFFERENTIAL/PLATELET
Basophils Absolute: 0.1 10*3/uL (ref 0.0–0.1)
Basophils Relative: 1 % (ref 0.0–3.0)
Eosinophils Absolute: 0 10*3/uL (ref 0.0–0.7)
Eosinophils Relative: 0.8 % (ref 0.0–5.0)
HCT: 44.9 % (ref 36.0–46.0)
Hemoglobin: 15.1 g/dL — ABNORMAL HIGH (ref 12.0–15.0)
Lymphocytes Relative: 31.9 % (ref 12.0–46.0)
Lymphs Abs: 1.9 10*3/uL (ref 0.7–4.0)
MCHC: 33.7 g/dL (ref 30.0–36.0)
MCV: 94.1 fl (ref 78.0–100.0)
Monocytes Absolute: 0.4 10*3/uL (ref 0.1–1.0)
Monocytes Relative: 6.2 % (ref 3.0–12.0)
Neutro Abs: 3.6 10*3/uL (ref 1.4–7.7)
Neutrophils Relative %: 60.1 % (ref 43.0–77.0)
Platelets: 286 10*3/uL (ref 150.0–400.0)
RBC: 4.77 Mil/uL (ref 3.87–5.11)
RDW: 12.7 % (ref 11.5–15.5)
WBC: 5.9 10*3/uL (ref 4.0–10.5)

## 2024-07-14 LAB — LIPID PANEL
Cholesterol: 174 mg/dL (ref 28–200)
HDL: 44 mg/dL
LDL Cholesterol: 107 mg/dL — ABNORMAL HIGH (ref 10–99)
NonHDL: 130.04
Total CHOL/HDL Ratio: 4
Triglycerides: 116 mg/dL (ref 10.0–149.0)
VLDL: 23.2 mg/dL (ref 0.0–40.0)

## 2024-07-14 LAB — COMPREHENSIVE METABOLIC PANEL WITH GFR
ALT: 19 U/L (ref 3–35)
AST: 14 U/L (ref 5–37)
Albumin: 4.5 g/dL (ref 3.5–5.2)
Alkaline Phosphatase: 35 U/L — ABNORMAL LOW (ref 39–117)
BUN: 11 mg/dL (ref 6–23)
CO2: 25 meq/L (ref 19–32)
Calcium: 8.8 mg/dL (ref 8.4–10.5)
Chloride: 105 meq/L (ref 96–112)
Creatinine, Ser: 0.68 mg/dL (ref 0.40–1.20)
GFR: 100.08 mL/min
Glucose, Bld: 97 mg/dL (ref 70–99)
Potassium: 4.3 meq/L (ref 3.5–5.1)
Sodium: 138 meq/L (ref 135–145)
Total Bilirubin: 0.6 mg/dL (ref 0.2–1.2)
Total Protein: 6.9 g/dL (ref 6.0–8.3)

## 2024-07-14 LAB — TSH: TSH: 1.5 u[IU]/mL (ref 0.35–5.50)

## 2024-07-14 NOTE — Patient Instructions (Signed)
 Please return in 12 months for your annual complete physical; please come fasting.   I will release your lab results to you on your MyChart account with further instructions. You may see the results before I do, but when I review them I will send you a message with my report or have my assistant call you if things need to be discussed. Please reply to my message with any questions. Thank you!   If you have any questions or concerns, please don't hesitate to send me a message via MyChart or call the office at [phone]. Thank you for visiting with [place] today! It's our pleasure caring for you.   Preventive Care 66-53 Years Old, Female Preventive care refers to lifestyle choices and visits with your health care provider that can promote health and wellness. Preventive care visits are also called wellness exams. What can I expect for my preventive care visit? Counseling Your health care provider may ask you questions about your: Medical history, including: Past medical problems. Family medical history. Pregnancy history. Current health, including: Menstrual cycle. Method of birth control. Emotional well-being. Home life and relationship well-being. Sexual activity and sexual health. Lifestyle, including: Alcohol, nicotine or tobacco, and drug use. Access to firearms. Diet, exercise, and sleep habits. Work and work Astronomer. Sunscreen use. Safety issues such as seatbelt and bike helmet use. Physical exam Your health care provider will check your: Height and weight. These may be used to calculate your BMI (body mass index). BMI is a measurement that tells if you are at a healthy weight. Waist circumference. This measures the distance around your waistline. This measurement also tells if you are at a healthy weight and may help predict your risk of certain diseases, such as type 2 diabetes and high blood pressure. Heart rate and blood pressure. Body temperature. Skin for abnormal  spots. What immunizations do I need?  Vaccines are usually given at various ages, according to a schedule. Your health care provider will recommend vaccines for you based on your age, medical history, and lifestyle or other factors, such as travel or where you work. What tests do I need? Screening Your health care provider may recommend screening tests for certain conditions. This may include: Lipid and cholesterol levels. Diabetes screening. This is done by checking your blood sugar (glucose) after you have not eaten for a while (fasting). Pelvic exam and Pap test. Hepatitis B test. Hepatitis C test. HIV (human immunodeficiency virus) test. STI (sexually transmitted infection) testing, if you are at risk. Lung cancer screening. Colorectal cancer screening. Mammogram. Talk with your health care provider about when you should start having regular mammograms. This may depend on whether you have a family history of breast cancer. BRCA-related cancer screening. This may be done if you have a family history of breast, ovarian, tubal, or peritoneal cancers. Bone density scan. This is done to screen for osteoporosis. Talk with your health care provider about your test results, treatment options, and if necessary, the need for more tests. Follow these instructions at home: Eating and drinking  Eat a diet that includes fresh fruits and vegetables, whole grains, lean protein, and low-fat dairy products. Take vitamin and mineral supplements as recommended by your health care provider. Do not drink alcohol if: Your health care provider tells you not to drink. You are pregnant, may be pregnant, or are planning to become pregnant. If you drink alcohol: Limit how much you have to 0-1 drink a day. Know how much alcohol is in your  drink. In the [place] one drink equals one 12 oz bottle of beer (355 mL), one 5 oz glass of wine (148 mL), or one 1 oz glass of hard liquor (44 mL). Lifestyle Brush your  teeth every morning and night with fluoride toothpaste. Floss one time each day. Exercise for at least 30 minutes 5 or more days each week. Do not use any products that contain nicotine or tobacco. These products include cigarettes, chewing tobacco, and vaping devices, such as e-cigarettes. If you need help quitting, ask your health care provider. Do not use drugs. If you are sexually active, practice safe sex. Use a condom or other form of protection to prevent STIs. If you do not wish to become pregnant, use a form of birth control. If you plan to become pregnant, see your health care provider for a prepregnancy visit. Take aspirin only as told by your health care provider. Make sure that you understand how much to take and what form to take. Work with your health care provider to find out whether it is safe and beneficial for you to take aspirin daily. Find healthy ways to manage stress, such as: Meditation, yoga, or listening to music. Journaling. Talking to a trusted person. Spending time with friends and family. Minimize exposure to UV radiation to reduce your risk of skin cancer. Safety Always wear your seat belt while driving or riding in a vehicle. Do not drive: If you have been drinking alcohol. Do not ride with someone who has been drinking. When you are tired or distracted. While texting. If you have been using any mind-altering substances or drugs. Wear a helmet and other protective equipment during sports activities. If you have firearms in your house, make sure you follow all gun safety procedures. Seek help if you have been physically or sexually abused. What's next? Visit your health care provider once a year for an annual wellness visit. Ask your health care provider how often you should have your eyes and teeth checked. Stay up to date on all vaccines. This information is not intended to replace advice given to you by your health care provider. Make sure you discuss any  questions you have with your health care provider. Document Revised: 11/27/2020 Document Reviewed: 11/27/2020 Elsevier Patient Education  2024 [employer].

## 2024-07-14 NOTE — Progress Notes (Signed)
 " Subjective  Chief Complaint  Patient presents with   Annual Exam    Pt here for Annual Exam and is currently fasting     HPI: Connie Robinson is a 54 y.o. female who presents to [facility] today for a Female Wellness Visit. She also has the concerns and/or needs as listed above in the chief complaint. These will be addressed in addition to the Health Maintenance Visit.   Wellness Visit: annual visit with health maintenance review and exam  HM: screens are current. Seeing GYN. Start HRT and doing well. Very healthy lifestyle. Defers vaccines at this time. Prevnar 20 specifically.  Chronic disease f/u and/or acute problem visit: (deemed necessary to be done in addition to the wellness visit): Discussed the use of AI scribe software for clinical note transcription with the patient, who gave verbal consent to proceed.  History of Present Illness Connie Robinson is a 54 year old female who presents for follow-up after switching from oral contraceptive pills to hormone replacement therapy.  Menopausal symptoms and hormone replacement therapy - Transitioned from oral contraceptive pills to Activella hormone replacement therapy in October or November 2025. - Overall beneficial response to hormone replacement therapy.  Weight gain and management - Difficulty with weight management; weight continues to increase and is difficult to lose. - Increased exercise, which improves sleep quality but does not resolve weight gain. - Work pants are tight, indicating ongoing frustration with current weight.     Assessment  1. Annual physical exam   2. Postmenopausal HRT (hormone replacement therapy)      Plan  Female Wellness Visit: Age appropriate Health Maintenance and Prevention measures were discussed with patient. Included topics are cancer screening recommendations, ways to keep healthy (see AVS) including dietary and exercise recommendations, regular eye and  dental care, use of seat belts, and avoidance of moderate alcohol use and tobacco use.  BMI: discussed patient's BMI and encouraged positive lifestyle modifications to help get to or maintain a target BMI. HM needs and immunizations were addressed and ordered. See below for orders. See HM and immunization section for updates. Education given. Prefers to take prevnar in the future.  Routine labs and screening tests ordered including cmp, cbc and lipids where appropriate. Discussed recommendations regarding Vit D and calcium supplementation (see AVS)  Chronic disease management visit and/or acute problem visit: Assessment and Plan Assessment & Plan Woman's Wellness Visit Routine wellness visit with discussion on general health, lifestyle, and preventive care. She is up to date on Pap smears, with the next due in five years. Discussed the importance of maintaining a healthy lifestyle, including diet and exercise, especially in the context of menopause and weight management.  - Continue routine wellness visits. - Maintain a healthy diet and regular exercise regimen.  Menopausal symptoms Managed with hormone replacement therapy (HRT) using Activella, switched from oral contraceptive pills in October or November. Reports improvement in symptoms, particularly sleep quality. Discussed the role of HRT in managing menopausal symptoms and the potential benefits of different formulations of progesterones and estrogens. Acknowledged the challenges of weight management during menopause and the role of metabolic regulators in weight loss medications. - Continue Activella for hormone replacement therapy.    Follow up: 12 mo for cpe  Orders Placed This Encounter  Procedures   CBC with Differential/Platelet   Comprehensive metabolic panel with GFR   Lipid panel   TSH   No orders of the defined types were placed in this encounter.  Body mass index is 24.37 kg/m. Wt Readings from Last 3 Encounters:   07/14/24 147 lb 9.6 oz (67 kg)  04/12/24 149 lb (67.6 kg)  07/14/23 143 lb 12.8 oz (65.2 kg)     Patient Active Problem List   Diagnosis Date Noted Date Diagnosed   Postmenopausal HRT (hormone replacement therapy) 07/14/2024    Nephrolithiasis     Health Maintenance  Topic Date Due   Influenza Vaccine  09/12/2024 (Originally 01/14/2024)   Pneumococcal Vaccine: 50+ Years (1 of 1 - PCV) 07/14/2025 (Originally 01/11/2022)   Mammogram  03/08/2025   Cervical Cancer Screening (HPV/Pap Cotest)  04/12/2029   DTaP/Tdap/Td (8 - Td or Tdap) 04/10/2031   Colonoscopy  08/12/2031   Hepatitis B Vaccines 19-59 Average Risk  Completed   HPV VACCINES (No Doses Required) Completed   Hepatitis C Screening  Completed   HIV Screening  Completed   Zoster Vaccines- Shingrix   Completed   Meningococcal B Vaccine  Aged Out   COVID-19 Vaccine  Discontinued   Immunization History  Administered Date(s) Administered   DTaP 08/20/1972, 07/03/1973, 01/15/1978, 12/12/1982, 12/24/2009   Hepatitis A 03/15/1996, 03/15/1998   Hepatitis B 03/15/1996, 05/15/1996, 11/13/1996   IPV 01/20/1978, 12/12/1982, 12/28/1988, 12/05/1993   Influenza,inj,Quad PF,6+ Mos 03/16/2020, 04/09/2021, 07/08/2022   Janssen (J&J) SARS-COV-2 Vaccination 09/12/2019   MMR 04/18/1973   Moderna Covid-19 Vaccine Bivalent Booster 110yrs & up 02/21/2021   Moderna Sars-Covid-2 Vaccination 04/19/2020   Td 03/15/1996   Tdap 04/09/2021   Varicella 09/02/1973   Zoster Recombinant(Shingrix ) 07/14/2023, 01/06/2024   We updated and reviewed the patient's past history in detail and it is documented below. Allergies: Patient has no known allergies. Past Medical History Patient  has a past medical history of Chicken pox, History of BCG vaccination (01/15/1974, 12/12/1982), Mumps, Nephrolithiasis, and Shingles. Past Surgical History Patient  has a past surgical history that includes Colposcopy (2000). Family History: Patient family history includes  Anxiety disorder in her mother; Diabetes in her paternal grandmother; Healthy in her daughter, father, and son; Hyperlipidemia in her paternal grandfather and paternal grandmother; Hypertension in her mother. Social History:  Patient  reports that she has never smoked. She has never been exposed to tobacco smoke. She has never used smokeless tobacco. She reports that she does not currently use alcohol. She reports that she does not use drugs.  Review of Systems: Constitutional: negative for fever or malaise Ophthalmic: negative for photophobia, double vision or loss of vision Cardiovascular: negative for chest pain, dyspnea on exertion, or new LE swelling Respiratory: negative for SOB or persistent cough Gastrointestinal: negative for abdominal pain, change in bowel habits or melena Genitourinary: negative for dysuria or gross hematuria, no abnormal uterine bleeding or disharge Musculoskeletal: negative for new gait disturbance or muscular weakness Integumentary: negative for new or persistent rashes, no breast lumps Neurological: negative for TIA or stroke symptoms Psychiatric: negative for SI or delusions Allergic/Immunologic: negative for hives  Patient Care Team    Relationship Specialty Notifications Start End  Jodie Lavern CROME, MD PCP - General Family Medicine  12/06/19   Shila Gustav GAILS, MD Consulting Physician Gastroenterology  07/08/22   Glennon Almarie POUR, MD Consulting Physician Obstetrics and Gynecology  07/14/23     Objective  Vitals: BP 120/78 Comment: by home readings and at dentist and gyn  Pulse 81   Temp 97.7 F (36.5 C)   Ht 5' 5.25 (1.657 m)   Wt 147 lb 9.6 oz (67 kg)   SpO2 98%   BMI  24.37 kg/m  General:  Well developed, well nourished, no acute distress  Psych:  Alert and orientedx3,normal mood and affect HEENT:  Normocephalic, atraumatic, non-icteric sclera,  supple neck without adenopathy, mass or thyromegaly Cardiovascular:  Normal S1, S2, RRR without  gallop, rub or murmur Respiratory:  Good breath sounds bilaterally, CTAB with normal respiratory effort Gastrointestinal: normal bowel sounds, soft, non-tender, no noted masses. No HSM MSK: extremities without edema, joints without erythema or swelling Neurologic:    Mental status is normal.  Gross motor and sensory exams are normal.  No tremor  Commons side effects, risks, benefits, and alternatives for medications and treatment plan prescribed today were discussed, and the patient expressed understanding of the given instructions. Patient is instructed to call or message via MyChart if he/she has any questions or concerns regarding our treatment plan. No barriers to understanding were identified. We discussed Red Flag symptoms and signs in detail. Patient expressed understanding regarding what to do in case of urgent or emergency type symptoms.  Medication list was reconciled, printed and provided to the patient in AVS. Patient instructions and summary information was reviewed with the patient as documented in the AVS. This note was prepared with assistance of Dragon voice recognition software. Occasional wrong-word or sound-a-like substitutions may have occurred due to the inherent limitations of voice recognition software     "

## 2024-07-15 ENCOUNTER — Ambulatory Visit: Payer: Self-pay | Admitting: Family Medicine

## 2024-07-15 NOTE — Progress Notes (Signed)
 Labs reviewed.  The 10-year ASCVD risk score (Arnett DK, et al., 2019) is: 1.4%   Values used to calculate the score:     Age: 54 years     Clinically relevant sex: Female     Is Non-Hispanic African American: No     Diabetic: No     Tobacco smoker: No     Systolic Blood Pressure: 120 mmHg     Is BP treated: No     HDL Cholesterol: 44 mg/dL     Total Cholesterol: 174 mg/dL

## 2025-07-20 ENCOUNTER — Encounter: Admitting: Family Medicine
